# Patient Record
Sex: Male | Born: 1970 | Race: Black or African American | Hispanic: No | Marital: Married | Smoking: Current some day smoker
Health system: Southern US, Community
[De-identification: ages and names within clinical notes are randomized; demographics above are authoritative.]

## PROBLEM LIST (undated history)

## (undated) DIAGNOSIS — K5792 Diverticulitis of intestine, part unspecified, without perforation or abscess without bleeding: Secondary | ICD-10-CM

## (undated) DIAGNOSIS — N289 Disorder of kidney and ureter, unspecified: Secondary | ICD-10-CM

## (undated) HISTORY — PX: CHOLECYSTECTOMY: SHX55

## (undated) HISTORY — PX: HERNIA REPAIR: SHX51

## (undated) HISTORY — PX: SMALL INTESTINE SURGERY: SHX150

---

## 2018-11-06 ENCOUNTER — Other Ambulatory Visit (HOSPITAL_BASED_OUTPATIENT_CLINIC_OR_DEPARTMENT_OTHER): Payer: Self-pay

## 2018-11-06 DIAGNOSIS — G4733 Obstructive sleep apnea (adult) (pediatric): Secondary | ICD-10-CM

## 2018-11-30 ENCOUNTER — Other Ambulatory Visit: Payer: Self-pay

## 2018-11-30 ENCOUNTER — Encounter (HOSPITAL_BASED_OUTPATIENT_CLINIC_OR_DEPARTMENT_OTHER): Payer: BC Managed Care – PPO | Admitting: Internal Medicine

## 2018-11-30 ENCOUNTER — Ambulatory Visit (HOSPITAL_BASED_OUTPATIENT_CLINIC_OR_DEPARTMENT_OTHER): Payer: BC Managed Care – PPO | Attending: Otolaryngology | Admitting: Internal Medicine

## 2018-11-30 DIAGNOSIS — R0902 Hypoxemia: Secondary | ICD-10-CM | POA: Insufficient documentation

## 2018-11-30 DIAGNOSIS — G4733 Obstructive sleep apnea (adult) (pediatric): Secondary | ICD-10-CM | POA: Diagnosis present

## 2018-12-04 DIAGNOSIS — G4733 Obstructive sleep apnea (adult) (pediatric): Secondary | ICD-10-CM | POA: Insufficient documentation

## 2018-12-05 DIAGNOSIS — G4733 Obstructive sleep apnea (adult) (pediatric): Secondary | ICD-10-CM

## 2018-12-05 DIAGNOSIS — G4736 Sleep related hypoventilation in conditions classified elsewhere: Secondary | ICD-10-CM | POA: Diagnosis not present

## 2018-12-05 NOTE — Procedures (Signed)
   Patient Name: Cody Hurst, Cody Hurst Date: 11/30/2018 Gender: Male D.O.B: Aug 27, 1970 Age (years): 48 Referring Provider: Melida Quitter Height (inches): 44 Interpreting Physician: Baird Lyons MD, ABSM Weight (lbs): 250 RPSGT: Jacolyn Reedy BMI: 32 MRN: [id number] Neck Size: 17.00  CLINICAL INFORMATION Sleep Study Type: HST Indication for sleep study: OSA Epworth Sleepiness Score: 17  SLEEP STUDY TECHNIQUE A multi-channel overnight portable sleep study was performed. The channels recorded were: nasal airflow, thoracic respiratory movement, and oxygen saturation with a pulse oximetry. Snoring was also monitored.  MEDICATIONS Patient self administered medications include: none reported.  SLEEP ARCHITECTURE Patient was studied for 379.3 minutes. The sleep efficiency was 99.5 % and the patient was supine for 77.3%. The arousal index was 0.0 per hour.  RESPIRATORY PARAMETERS The overall AHI was 85.6 per hour, with a central apnea index of 0.0 per hour. The oxygen nadir was 80% during sleep.  CARDIAC DATA Mean heart rate during sleep was 72.7 bpm.  IMPRESSIONS - Severe obstructive sleep apnea occurred during this study (AHI = 85.6/h). - No significant central sleep apnea occurred during this study (CAI = 0.0/h). - Oxygen desaturation was noted during this study (Min O2 = 80%). Mean sat 93%. - Patient snored.  DIAGNOSIS - Obstructive Sleep Apnea (327.23 [G47.33 ICD-10]) - Nocturnal Hypoxemia (327.26 [G47.36 ICD-10])  RECOMMENDATIONS - Suggest CPAP titration sleep study or autopap. Other options would bee based on clinical judgment. - Be careful with alcohol, sedatives and other CNS depressants that may worsen sleep apnea and disrupt normal sleep architecture. - Sleep hygiene should be reviewed to assess factors that may improve sleep quality. - Weight management and regular exercise should be initiated or continued.  [Electronically signed] 12/05/2018 11:28 AM   Baird Lyons MD, ABSM Diplomate, American Board of Sleep Medicine   NPI: [id number]                          [place] [place] [facility]  ELECTRONICALLY SIGNED ON:  12/05/2018, 11:23 AM [place] PH: [phone]   FX: [phone] [place]

## 2019-01-19 ENCOUNTER — Other Ambulatory Visit: Payer: Self-pay

## 2019-01-19 DIAGNOSIS — Z20822 Contact with and (suspected) exposure to covid-19: Secondary | ICD-10-CM

## 2019-01-21 LAB — NOVEL CORONAVIRUS, NAA: SARS-CoV-2, NAA: NOT DETECTED

## 2019-06-02 ENCOUNTER — Ambulatory Visit (HOSPITAL_COMMUNITY)
Admission: EM | Admit: 2019-06-02 | Discharge: 2019-06-02 | Disposition: A | Discharge status: Home / Self Care | Payer: 59

## 2019-06-02 ENCOUNTER — Encounter (HOSPITAL_COMMUNITY): Payer: Self-pay

## 2019-06-02 ENCOUNTER — Other Ambulatory Visit: Payer: Self-pay

## 2019-06-02 DIAGNOSIS — M545 Low back pain, unspecified: Secondary | ICD-10-CM

## 2019-06-02 MED ORDER — PREDNISONE 10 MG PO TABS: ORAL_TABLET | ORAL | 0 refills | Status: DC

## 2019-06-02 MED ORDER — CYCLOBENZAPRINE HCL 5 MG PO TABS: 5.0000 mg | ORAL_TABLET | Freq: Two times a day (BID) | ORAL | 0 refills | Status: DC | PRN

## 2019-06-02 NOTE — ED Provider Notes (Signed)
[facility]    CSN: [id number] Arrival date & time: 06/02/19  1105      History   Chief Complaint Chief Complaint  Patient presents with  . Back Pain    HPI Cody Hurst is a 49 y.o. male no significant past medical history presenting today for evaluation of back pain.  Patient developed left lower back pain beginning Monday evening.  Denies any specific injury or trauma.  Does report repetitive lifting at work, but denies repetitive heavy lifting.  Denies any urinary symptoms.  Denies hematuria.  Does have history of prior kidney stones, but this pain is different.  Denies radiation into the leg or any numbness or tingling.  Does feel over the past 6 to 7 months at times he has had some paresthesias into left leg.  Using Tylenol.  Does report history of renal insufficiency, but believes his last measurements were returning to normal.  Does not have recent creatinine on file.  Denies saddle anesthesia.  Denies loss of control of urination or bowels.  HPI  History reviewed. No pertinent past medical history.  Patient Active Problem List   Diagnosis Date Noted  . OSA (obstructive sleep apnea) 12/04/2018    History reviewed. No pertinent surgical history.     Home Medications    Prior to Admission medications   Medication Sig Start Date End Date Taking? Authorizing Provider  fluticasone (FLONASE) 50 MCG/ACT nasal spray 2 sprays by Each Nare route daily. 10/13/18  Yes [provider]  naproxen (NAPROSYN) 500 MG tablet Take by mouth. 04/25/17  Yes [provider]  oxyCODONE-acetaminophen (PERCOCET/ROXICET) 5-325 MG tablet 1-2 tabs every 6 hours for severe pain for 1 week, then follow taper 05/05/15  Yes [provider]  B Complex Vitamins (VITAMIN B COMPLEX) TABS Take by mouth.    [provider]  cyclobenzaprine (FLEXERIL) 5 MG tablet Take 1-2 tablets (5-10 mg total) by mouth 2 (two) times daily as needed for muscle spasms. 06/02/19    Will Schier C, PA-C  Multiple Vitamin (MULTIVITAMIN) capsule Take by mouth.    [provider]  Omega-3 1000 MG CAPS Take by mouth.    [provider]  predniSONE (DELTASONE) 10 MG tablet Begin with 6 tabs on day 1, 5 tab on day 2, 4 tab on day 3, 3 tab on day 4, 2 tab on day 5, 1 tab on day 6-take with food 06/02/19   Codylee Patil C, PA-C  ranitidine (ZANTAC) 150 MG tablet Take by mouth.    [provider]    Family History Family History  Problem Relation Age of Onset  . Cancer Mother   . Allergies Mother   . Healthy Father     Social History Social History   Tobacco Use  . Smoking status: Never Smoker  . Smokeless tobacco: Never Used  Substance Use Topics  . Alcohol use: Not Currently  . Drug use: Never     Allergies   Patient has no known allergies.   Review of Systems Review of Systems  Constitutional: Negative for fever.  HENT: Negative for sore throat.   Respiratory: Negative for shortness of breath.   Cardiovascular: Negative for chest pain.  Gastrointestinal: Negative for abdominal pain, nausea and vomiting.  Genitourinary: Negative for difficulty urinating, discharge, dysuria, frequency, penile pain, penile swelling, scrotal swelling and testicular pain.  Musculoskeletal: Positive for back pain and myalgias.  Skin: Negative for rash.  Neurological: Negative for dizziness, light-headedness and headaches.  Physical Exam Triage Vital Signs ED Triage Vitals  Enc Vitals Group     BP 06/02/19 1221 125/77     Pulse Rate 06/02/19 1221 80     Resp 06/02/19 1221 18     Temp 06/02/19 1221 98.2 F (36.8 C)     Temp Source 06/02/19 1221 Oral     SpO2 06/02/19 1221 98 %     Weight 06/02/19 1217 238 lb 3.2 oz (108 kg)     Height --      Head Circumference --      Peak Flow --      Pain Score 06/02/19 1217 8     Pain Loc --      Pain Edu? --      Excl. in [place]? --    No data found.  Updated Vital Signs BP 125/77 (BP  Location: Right Arm)   Pulse 80   Temp 98.2 F (36.8 C) (Oral)   Resp 18   Wt 238 lb 3.2 oz (108 kg)   SpO2 98%   BMI 30.58 kg/m   Visual Acuity Right Eye Distance:   Left Eye Distance:   Bilateral Distance:    Right Eye Near:   Left Eye Near:    Bilateral Near:     Physical Exam Vitals and nursing note reviewed.  Constitutional:      Appearance: He is well-developed.     Comments: No acute distress  HENT:     Head: Normocephalic and atraumatic.     Nose: Nose normal.  Eyes:     Conjunctiva/sclera: Conjunctivae normal.  Cardiovascular:     Rate and Rhythm: Normal rate.  Pulmonary:     Effort: Pulmonary effort is normal. No respiratory distress.  Abdominal:     General: There is no distension.  Musculoskeletal:        General: Normal range of motion.     Cervical back: Neck supple.     Comments: Nontender to palpation of thoracic and lumbar spine midline, increased tenderness to lower lumbar paraspinal musculature extending more laterally, nontender on right  Strength at hips and knees 5/5 and equal bilaterally, patellar reflex 2+ bilaterally, negative straight leg raise  Skin:    General: Skin is warm and dry.  Neurological:     Mental Status: He is alert and oriented to person, place, and time.      UC Treatments / Results  Labs (all labs ordered are listed, but only abnormal results are displayed) Labs Reviewed - No data to display  EKG   Radiology No results found.  Procedures Procedures (including critical care time)  Medications Ordered in UC Medications - No data to display  Initial Impression / Assessment and Plan / UC Course  I have reviewed the triage vital signs and the nursing notes.  Pertinent labs & imaging results that were available during my care of the patient were reviewed by me and considered in my medical decision making (see chart for details).     Suspect likely muscular strain of lumbar region of back.  Has history of  renal insufficiency, but no recent creatinine.  Will opt for course of steroids over NSAIDs as well as muscle relaxers.  Discussed activity modification.  Discussed strict return precautions. Patient verbalized understanding and is agreeable with plan.  Final Clinical Impressions(s) / UC Diagnoses   Final diagnoses:  Acute left-sided low back pain without sciatica     Discharge Instructions     Begin prednisone taper  over the next 6 days-begin with 6 tablets, decrease by 1 tablet each day until complete, take with food and in the morning if you are able  You may use flexeril as needed to help with pain. This is a muscle relaxer and causes sedation- please use only at bedtime or when you will be home and not have to drive/work  Alternate ice and heat  Gentle stretching-see attached  Please follow-up if symptoms not improving or worsening   ED Prescriptions    Medication Sig Dispense Auth. Provider   predniSONE (DELTASONE) 10 MG tablet Begin with 6 tabs on day 1, 5 tab on day 2, 4 tab on day 3, 3 tab on day 4, 2 tab on day 5, 1 tab on day 6-take with food 21 tablet Pricsilla Lindvall C, PA-C   cyclobenzaprine (FLEXERIL) 5 MG tablet Take 1-2 tablets (5-10 mg total) by mouth 2 (two) times daily as needed for muscle spasms. 24 tablet Marlaya Turck, [place] C, PA-C     PDMP not reviewed this encounter.   Lew Dawes, PA-C 06/02/19 1244

## 2019-06-02 NOTE — Discharge Instructions (Signed)
Begin prednisone taper over the next 6 days-begin with 6 tablets, decrease by 1 tablet each day until complete, take with food and in the morning if you are able  You may use flexeril as needed to help with pain. This is a muscle relaxer and causes sedation- please use only at bedtime or when you will be home and not have to drive/work  Alternate ice and heat  Gentle stretching-see attached  Please follow-up if symptoms not improving or worsening

## 2019-06-02 NOTE — ED Triage Notes (Signed)
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 Pt has taken Tylenol to relieve discomfort.

## 2019-08-31 DIAGNOSIS — R1033 Periumbilical pain: Secondary | ICD-10-CM | POA: Insufficient documentation

## 2019-09-01 ENCOUNTER — Emergency Department (HOSPITAL_COMMUNITY): Payer: Self-pay

## 2019-09-01 ENCOUNTER — Emergency Department (HOSPITAL_COMMUNITY)
Admission: EM | Admit: 2019-09-01 | Discharge: 2019-09-01 | Disposition: A | Discharge status: Home / Self Care | Payer: Self-pay | Attending: Emergency Medicine | Admitting: Emergency Medicine

## 2019-09-01 ENCOUNTER — Other Ambulatory Visit: Payer: Self-pay

## 2019-09-01 ENCOUNTER — Encounter (HOSPITAL_COMMUNITY): Payer: Self-pay | Admitting: Emergency Medicine

## 2019-09-01 DIAGNOSIS — R1033 Periumbilical pain: Secondary | ICD-10-CM

## 2019-09-01 LAB — COMPREHENSIVE METABOLIC PANEL
ALT: 35 U/L (ref 0–44)
AST: 24 U/L (ref 15–41)
Albumin: 3.7 g/dL (ref 3.5–5.0)
Alkaline Phosphatase: 81 U/L (ref 38–126)
Anion gap: 13 (ref 5–15)
BUN: 21 mg/dL — ABNORMAL HIGH (ref 6–20)
CO2: 24 mmol/L (ref 22–32)
Calcium: 9.5 mg/dL (ref 8.9–10.3)
Chloride: 102 mmol/L (ref 98–111)
Creatinine, Ser: 1.32 mg/dL — ABNORMAL HIGH (ref 0.61–1.24)
GFR calc Af Amer: >60 mL/min (ref >60)
GFR calc non Af Amer: >60 mL/min (ref >60)
Glucose, Bld: 126 mg/dL — ABNORMAL HIGH (ref 70–99)
Potassium: 3.9 mmol/L (ref 3.5–5.1)
Sodium: 139 mmol/L (ref 135–145)
Total Bilirubin: 0.6 mg/dL (ref 0.3–1.2)
Total Protein: 7.1 g/dL (ref 6.5–8.1)

## 2019-09-01 LAB — CBC
HCT: 50.5 % (ref 39.0–52.0)
Hemoglobin: 16.2 g/dL (ref 13.0–17.0)
MCH: 30.6 pg (ref 26.0–34.0)
MCHC: 32.1 g/dL (ref 30.0–36.0)
MCV: 95.5 fL (ref 80.0–100.0)
Platelets: 217 10*3/uL (ref 150–400)
RBC: 5.29 MIL/uL (ref 4.22–5.81)
RDW: 13.3 % (ref 11.5–15.5)
WBC: 9.3 10*3/uL (ref 4.0–10.5)
nRBC: 0 % (ref 0.0–0.2)

## 2019-09-01 LAB — URINALYSIS, ROUTINE W REFLEX MICROSCOPIC
Bilirubin Urine: NEGATIVE
Glucose, UA: NEGATIVE mg/dL
Hgb urine dipstick: NEGATIVE
Ketones, ur: NEGATIVE mg/dL
Leukocytes,Ua: NEGATIVE
Nitrite: NEGATIVE
Protein, ur: NEGATIVE mg/dL
Specific Gravity, Urine: 1.015 (ref 1.005–1.030)
pH: 6 (ref 5.0–8.0)

## 2019-09-01 LAB — LIPASE, BLOOD: Lipase: 29 U/L (ref 11–51)

## 2019-09-01 MED ORDER — IOHEXOL 300 MG/ML  SOLN
100.0000 mL | Freq: Once | INTRAMUSCULAR | Status: AC | PRN
  Administered 2019-09-01: 100 mL via INTRAVENOUS

## 2019-09-01 MED ORDER — ONDANSETRON HCL 4 MG/2ML IJ SOLN
4.0000 mg | Freq: Once | INTRAMUSCULAR | Status: AC
  Administered 2019-09-01: 4 mg via INTRAVENOUS
  Filled 2019-09-01: qty 2

## 2019-09-01 MED ORDER — SODIUM CHLORIDE 0.9 % IV BOLUS
1000.0000 mL | Freq: Once | INTRAVENOUS | Status: AC
  Administered 2019-09-01: 1000 mL via INTRAVENOUS

## 2019-09-01 MED ORDER — MORPHINE SULFATE (PF) 4 MG/ML IV SOLN
4.0000 mg | Freq: Once | INTRAVENOUS | Status: AC
  Administered 2019-09-01: 4 mg via INTRAVENOUS
  Filled 2019-09-01: qty 1

## 2019-09-01 MED ORDER — SODIUM CHLORIDE 0.9% FLUSH: 3.0000 mL | Freq: Once | INTRAVENOUS | Status: DC

## 2019-09-01 NOTE — ED Notes (Signed)
Requested work note from [place]. Patient verbalizes understanding of discharge instructions. Opportunity for questioning and answers were provided. Armband removed by staff, pt discharged from ED

## 2019-09-01 NOTE — Discharge Instructions (Signed)
You may start taking a stool softener given your history of diverticulitis.  Follow-up with general surgery for evaluation of your abdominal hernia.  Try not to lift or strain as this may cause your hernia to worsen.  If you notice a severe bulging to your umbilical area which is not able to be pushed back into place please seek reevaluation emergency department

## 2019-09-01 NOTE — ED Provider Notes (Signed)
[facility] EMERGENCY [facility] Provider Note   CSN: [id number] Arrival date & time: 08/31/19  2358    History Chief Complaint  Patient presents with  . Abdominal Pain    Cody Hurst is a 49 y.o. male with past medical history significant for diverticulitis, OSA, umbilical hernia, prior bowel resection who presents for evaluation of abdominal pain.  Patient with intermittent lower abdominal pain however worsening this morning.  States he does a lot of lifting and pulling at worst.  He rates his pain a 3/10.  States his last bowel movement this morning was very small.  No melena or bright red blood per rectum.  He is passing flatulence.  He is not taking thing for his pain.  Patient states he has had intermittent abdominal pain located over the site of his hernias over the last year.  States these are typically worse when he lifts heavy objects at work.  He denies any headache, lightheadedness, dizziness, chest pain, shortness of breath, dysuria, diarrhea, constipation.  Denies additional aggravating or alleviating factors.  History obtained from patient and past medical records.  No interpreter is used.  HPI     History reviewed. No pertinent past medical history.  Patient Active Problem List   Diagnosis Date Noted  . OSA (obstructive sleep apnea) 12/04/2018    History reviewed. No pertinent surgical history.     Family History  Problem Relation Age of Onset  . Cancer Mother   . Allergies Mother   . Healthy Father     Social History   Tobacco Use  . Smoking status: Never Smoker  . Smokeless tobacco: Never Used  Substance Use Topics  . Alcohol use: Not Currently  . Drug use: Never    Home Medications Prior to Admission medications   Medication Sig Start Date End Date Taking? Authorizing Provider  acetaminophen (TYLENOL) 500 MG tablet Take 1,000 mg by mouth every 6 (six) hours as needed for mild pain or headache.   Yes [provider]    Multiple Vitamin (MULTIVITAMIN) capsule Take 1 capsule by mouth daily.    Yes [provider]  cyclobenzaprine (FLEXERIL) 5 MG tablet Take 1-2 tablets (5-10 mg total) by mouth 2 (two) times daily as needed for muscle spasms. Patient not taking: Reported on 09/01/2019 06/02/19   Wieters, Hallie C, PA-C  predniSONE (DELTASONE) 10 MG tablet Begin with 6 tabs on day 1, 5 tab on day 2, 4 tab on day 3, 3 tab on day 4, 2 tab on day 5, 1 tab on day 6-take with food Patient not taking: Reported on 09/01/2019 06/02/19   Wieters, Fran [place] C, PA-C    Allergies    Patient has no known allergies.  Review of Systems   Review of Systems  Constitutional: Negative.   HENT: Negative.   Eyes: Negative.   Respiratory: Negative.   Cardiovascular: Negative.   Gastrointestinal: Positive for abdominal pain. Negative for abdominal distention, anal bleeding, blood in stool, constipation, diarrhea, nausea, rectal pain and vomiting.  Genitourinary: Negative.   Skin: Negative.   Neurological: Negative.   All other systems reviewed and are negative.  Physical Exam Updated Vital Signs BP 106/81   Pulse 65   Temp 97.9 F (36.6 C) (Oral)   Resp 16   SpO2 96%   Physical Exam Vitals and nursing note reviewed.  Constitutional:      General: He is not in acute distress.    Appearance: He is well-developed. He is not ill-appearing,  toxic-appearing or diaphoretic.  HENT:     Head: Normocephalic and atraumatic.     Mouth/Throat:     Mouth: Mucous membranes are moist.  Eyes:     Pupils: Pupils are equal, round, and reactive to light.  Cardiovascular:     Rate and Rhythm: Normal rate and regular rhythm.     Heart sounds: Normal heart sounds.  Pulmonary:     Effort: Pulmonary effort is normal. No respiratory distress.     Breath sounds: Normal breath sounds.  Abdominal:     General: Bowel sounds are normal. There is no distension.     Palpations: Abdomen is soft.     Tenderness: There is no abdominal  tenderness. There is no right CVA tenderness, left CVA tenderness, guarding or rebound. Negative signs include Murphy's sign and McBurney's sign.     Hernia: No hernia is present.     Comments: Periumbilical hernia able to reduce.  Nontender.  Old midline abdominal incision without edema, erythema or warmth  Musculoskeletal:        General: Normal range of motion.     Cervical back: Normal range of motion and neck supple.  Skin:    General: Skin is warm and dry.  Neurological:     General: No focal deficit present.     Mental Status: He is alert and oriented to person, place, and time.    ED Results / Procedures / Treatments   Labs (all labs ordered are listed, but only abnormal results are displayed) Labs Reviewed  COMPREHENSIVE METABOLIC PANEL - Abnormal; Notable for the following components:      Result Value   Glucose, Bld 126 (*)    BUN 21 (*)    Creatinine, Ser 1.32 (*)    All other components within normal limits  LIPASE, BLOOD  CBC  URINALYSIS, ROUTINE W REFLEX MICROSCOPIC   EKG None  Radiology No results found.  Procedures Procedures (including critical care time)  Medications Ordered in ED Medications  sodium chloride flush (NS) 0.9 % injection 3 mL (has no administration in time range)  morphine 4 MG/ML injection 4 mg (4 mg Intravenous Given 09/01/19 1140)  ondansetron (ZOFRAN) injection 4 mg (4 mg Intravenous Given 09/01/19 1138)  sodium chloride 0.9 % bolus 1,000 mL (1,000 mLs Intravenous New Bag/Given 09/01/19 1138)  iohexol (OMNIPAQUE) 300 MG/ML solution 100 mL (100 mLs Intravenous Contrast Given 09/01/19 1219)   ED Course  I have reviewed the triage vital signs and the nursing notes.  Pertinent labs & imaging results that were available during my care of the patient were reviewed by me and considered in my medical decision making (see chart for details).  49 year old presents for evaluation of intermittent abdominal pain worsening over the last 2 days.  Known  history of umbilical hernia after extensive bowel resection due to prior of diverticulitis.  Last bowel movement this morning which she states was small.  No melena or bright blood per rectum.  Does have a reducible umbilical hernia.  Abdomen soft without rebound or guarding.  Heart lungs clear.  He is afebrile, nonseptic, not ill-appearing.  No chest pain, shortness of breath or midline abdominal pain radiating to his back.  No GU complaints.  She does not anything for pain at this time.  Labs imaging personally reviewed and interpreted: CBC without leukocytosis UA negative Metabolic panel with creatinine 1.32, no prior to compare Hyperglycemia to 126 Lipase 29 CTAP with periumbilical hernia. Right nephrolithiasis. No evidence of ureteral calculi,  hydronephrosis, or other acute findings. Hepatic steatosis. Small paraumbilical ventral hernia containing a small bowel loop.  Patient is nontoxic, nonseptic appearing, in no apparent distress.  Patient's pain and other symptoms adequately managed in emergency department.  Fluid bolus given.  Labs, imaging and vitals reviewed.  Patient does not meet the SIRS or Sepsis criteria.  On repeat exam patient does not have a surgical abdomin and there are no peritoneal signs.  No indication of appendicitis, bowel obstruction, bowel perforation, cholecystitis, diverticulitis, incarcerated or strangulated hernia  Patient reassessed.  He has no current pain.  Patient states pain is typically when he moves.  I have suspicion patient's pain is due to his hernia given when he relaxes his pain is resolved.  Will have him follow-up outpatient with general surgery.  He is tolerating p.o. intake without difficulty.  The patient has been appropriately medically screened and/or stabilized in the ED. I have low suspicion for any other emergent medical condition which would require further screening, evaluation or treatment in the ED or require inpatient management.  Patient is  hemodynamically stable and in no acute distress.  Patient able to ambulate in department prior to ED.  Evaluation does not show acute pathology that would require ongoing or additional emergent interventions while in the emergency department or further inpatient treatment.  I have discussed the diagnosis with the patient and answered all questions.  Pain is been managed while in the emergency department and patient has no further complaints prior to discharge.  Patient is comfortable with plan discussed in room and is stable for discharge at this time.  I have discussed strict return precautions for returning to the emergency department.  Patient was encouraged to follow-up with PCP/specialist refer to at discharge.       MDM Rules/Calculators/A&P                           Final Clinical Impression(s) / ED Diagnoses Final diagnoses:  Periumbilical abdominal pain    Rx / DC Orders ED Discharge Orders    None       Patsy Zaragoza A, PA-C 09/01/19 1359    Geoffery Lyons, MD 09/01/19 1540

## 2019-09-01 NOTE — ED Triage Notes (Signed)
Pt c/o lower abdominal pain, denies nausea/vomiting/diarrhea/urinary symptoms. Hx hernia.

## 2020-04-18 ENCOUNTER — Emergency Department (HOSPITAL_COMMUNITY)
Admission: EM | Admit: 2020-04-18 | Discharge: 2020-04-19 | Disposition: A | Discharge status: Home / Self Care | Payer: PRIVATE HEALTH INSURANCE | Attending: Emergency Medicine | Admitting: Emergency Medicine

## 2020-04-18 ENCOUNTER — Encounter (HOSPITAL_COMMUNITY): Payer: Self-pay

## 2020-04-18 ENCOUNTER — Other Ambulatory Visit: Payer: Self-pay

## 2020-04-18 DIAGNOSIS — R112 Nausea with vomiting, unspecified: Secondary | ICD-10-CM

## 2020-04-18 DIAGNOSIS — Z20822 Contact with and (suspected) exposure to covid-19: Secondary | ICD-10-CM | POA: Diagnosis not present

## 2020-04-18 DIAGNOSIS — R5383 Other fatigue: Secondary | ICD-10-CM | POA: Insufficient documentation

## 2020-04-18 DIAGNOSIS — K5732 Diverticulitis of large intestine without perforation or abscess without bleeding: Secondary | ICD-10-CM | POA: Insufficient documentation

## 2020-04-18 DIAGNOSIS — R42 Dizziness and giddiness: Secondary | ICD-10-CM | POA: Insufficient documentation

## 2020-04-18 DIAGNOSIS — R252 Cramp and spasm: Secondary | ICD-10-CM | POA: Insufficient documentation

## 2020-04-18 DIAGNOSIS — R1084 Generalized abdominal pain: Secondary | ICD-10-CM | POA: Insufficient documentation

## 2020-04-18 DIAGNOSIS — R197 Diarrhea, unspecified: Secondary | ICD-10-CM | POA: Insufficient documentation

## 2020-04-18 DIAGNOSIS — R519 Headache, unspecified: Secondary | ICD-10-CM | POA: Diagnosis not present

## 2020-04-18 HISTORY — DX: Disorder of kidney and ureter, unspecified: N28.9

## 2020-04-18 HISTORY — DX: Diverticulitis of intestine, part unspecified, without perforation or abscess without bleeding: K57.92

## 2020-04-18 LAB — URINALYSIS, ROUTINE W REFLEX MICROSCOPIC
Bacteria, UA: NONE SEEN
Bilirubin Urine: NEGATIVE
Glucose, UA: NEGATIVE mg/dL
Ketones, ur: NEGATIVE mg/dL
Leukocytes,Ua: NEGATIVE
Nitrite: NEGATIVE
Protein, ur: NEGATIVE mg/dL
Specific Gravity, Urine: 1.021 (ref 1.005–1.030)
pH: 5 (ref 5.0–8.0)

## 2020-04-18 LAB — CBC
HCT: 51.9 % (ref 39.0–52.0)
Hemoglobin: 16.9 g/dL (ref 13.0–17.0)
MCH: 30.7 pg (ref 26.0–34.0)
MCHC: 32.6 g/dL (ref 30.0–36.0)
MCV: 94.4 fL (ref 80.0–100.0)
Platelets: 182 10*3/uL (ref 150–400)
RBC: 5.5 MIL/uL (ref 4.22–5.81)
RDW: 13.6 % (ref 11.5–15.5)
WBC: 5.5 10*3/uL (ref 4.0–10.5)
nRBC: 0 % (ref 0.0–0.2)

## 2020-04-18 LAB — COMPREHENSIVE METABOLIC PANEL
ALT: 31 U/L (ref 0–44)
AST: 22 U/L (ref 15–41)
Albumin: 4.2 g/dL (ref 3.5–5.0)
Alkaline Phosphatase: 72 U/L (ref 38–126)
Anion gap: 9 (ref 5–15)
BUN: 22 mg/dL — ABNORMAL HIGH (ref 6–20)
CO2: 23 mmol/L (ref 22–32)
Calcium: 8.9 mg/dL (ref 8.9–10.3)
Chloride: 108 mmol/L (ref 98–111)
Creatinine, Ser: 1.16 mg/dL (ref 0.61–1.24)
GFR, Estimated: >60 mL/min (ref >60)
Glucose, Bld: 96 mg/dL (ref 70–99)
Potassium: 4.3 mmol/L (ref 3.5–5.1)
Sodium: 140 mmol/L (ref 135–145)
Total Bilirubin: 0.8 mg/dL (ref 0.3–1.2)
Total Protein: 7.4 g/dL (ref 6.5–8.1)

## 2020-04-18 LAB — CK: Total CK: 321 U/L (ref 49–397)

## 2020-04-18 LAB — POC OCCULT BLOOD, ED: Fecal Occult Bld: NEGATIVE

## 2020-04-18 LAB — LIPASE, BLOOD: Lipase: 28 U/L (ref 11–51)

## 2020-04-18 MED ORDER — KETOROLAC TROMETHAMINE 30 MG/ML IJ SOLN
30.0000 mg | Freq: Once | INTRAMUSCULAR | Status: AC
  Administered 2020-04-18: 30 mg via INTRAVENOUS
  Filled 2020-04-18: qty 1

## 2020-04-18 MED ORDER — SODIUM CHLORIDE 0.9 % IV BOLUS
1000.0000 mL | Freq: Once | INTRAVENOUS | Status: AC
  Administered 2020-04-18: 1000 mL via INTRAVENOUS

## 2020-04-18 MED ORDER — ONDANSETRON 4 MG PO TBDP
4.0000 mg | ORAL_TABLET | Freq: Once | ORAL | Status: AC | PRN
  Administered 2020-04-18: 4 mg via ORAL
  Filled 2020-04-18: qty 1

## 2020-04-18 MED ORDER — METOCLOPRAMIDE HCL 5 MG/ML IJ SOLN
10.0000 mg | Freq: Once | INTRAMUSCULAR | Status: AC
  Administered 2020-04-18: 10 mg via INTRAVENOUS
  Filled 2020-04-18: qty 2

## 2020-04-18 NOTE — ED Provider Notes (Signed)
[facility]-EMERGENCY DEPT Provider Note   CSN: [id number] Arrival date & time: 04/18/20  1701     History Chief Complaint  Patient presents with  . Diarrhea  . Headache  . Fatigue  . Abdominal Pain  . Fever    Cody Hurst is a 50 y.o. male with a history of diverticulitis s/p sigmoid colectomy with anastomosis, nephrolithiasis who presents the emergency department with a chief complaint of abdominal pain.  The patient reports constant, 7 out of 10, diffuse abdominal pain characterized as cramping that began today. Pain is non-radiating. Pain has been accompanied by nonbloody, nonbilious vomiting x8 and frequent episodes of diarrhea that also began today. Reports that diarrhea was his initial symptom followed by vomiting, abdominal pain, bilateral low back pain. He reports that he feels very dehydrated with fatigue and generalized muscle cramps. He has also developed a generalized headache with dizziness and lightheadedness and endorses a subjective fever and chills. He has not noticed any gross blood in his diarrhea, but earlier noted that it "had a metallic odor".   He denies dysuria, hematuria, neck pain or stiffness, rash, penile or testicular pain or swelling, chest pain, shortness of breath, cough, nasal congestion or rhinorrhea, constipation, numbness, weakness, visual changes, or flank pain. He has only voided 3 times today since onset of symptoms.  He does have a history of diverticulitis that required sigmoid colectomy and anastomosis many years ago. States that his current episode of pain feels different. He also has passed more than 30 kidney stones in his lifetime and does not think that the pain feels similar. He ate sushi yesterday at 1600, but was asymptomatic until he awoke this morning. His daughter also ate a small amount of the sushi and has been asymptomatic. He is vaccinated against COVID-19. No known sick contacts.  Cody Hurst was  evaluated in Emergency Department on 04/19/2020 for the symptoms described in the history of present illness. He was evaluated in the context of the global COVID-19 pandemic, which necessitated consideration that the patient might be at risk for infection with the SARS-CoV-2 virus that causes COVID-19. Institutional protocols and algorithms that pertain to the evaluation of patients at risk for COVID-19 are in a state of rapid change based on information released by regulatory bodies including the CDC and federal and state organizations. These policies and algorithms were followed during the patient's care in the ED.   The history is provided by the patient and medical records. No language interpreter was used.       Past Medical History:  Diagnosis Date  . Diverticulitis   . Renal disorder    kidney stones    Patient Active Problem List   Diagnosis Date Noted  . OSA (obstructive sleep apnea) 12/04/2018    Past Surgical History:  Procedure Laterality Date  . HERNIA REPAIR    . SMALL INTESTINE SURGERY         Family History  Problem Relation Age of Onset  . Cancer Mother   . Allergies Mother   . Healthy Father     Social History   Tobacco Use  . Smoking status: Never Smoker  . Smokeless tobacco: Never Used  Vaping Use  . Vaping Use: Never used  Substance Use Topics  . Alcohol use: Not Currently  . Drug use: Never    Home Medications Prior to Admission medications   Medication Sig Start Date End Date Taking? Authorizing Provider  loratadine-pseudoephedrine (CLARITIN-D 12 HOUR) 5-120 MG tablet  Take 1 tablet by mouth daily as needed for allergies.   Yes [provider]  ondansetron (ZOFRAN ODT) 4 MG disintegrating tablet Take 1 tablet (4 mg total) by mouth every 8 (eight) hours as needed. 04/19/20  Yes Brooklinn Longbottom A, PA-C  phenylephrine (NEO-SYNEPHRINE) 0.25 % nasal spray Place 1 spray into both nostrils every 6 (six) hours as needed for congestion.   Yes  [provider]    Allergies    Patient has no known allergies.  Review of Systems   Review of Systems  Constitutional: Positive for chills, fatigue and fever. Negative for appetite change and diaphoresis.  HENT: Negative for congestion, rhinorrhea and sore throat.   Respiratory: Negative for cough, shortness of breath and wheezing.   Cardiovascular: Negative for chest pain and palpitations.  Gastrointestinal: Positive for abdominal pain, diarrhea, nausea and vomiting. Negative for anal bleeding, blood in stool and constipation.  Genitourinary: Negative for dysuria, flank pain, frequency, hematuria, penile discharge, penile pain, penile swelling, scrotal swelling and urgency.  Musculoskeletal: Positive for myalgias. Negative for arthralgias, back pain, neck pain and neck stiffness.  Skin: Negative for rash and wound.  Allergic/Immunologic: Negative for immunocompromised state.  Neurological: Positive for dizziness, light-headedness and headaches. Negative for seizures, syncope, facial asymmetry and weakness.  Psychiatric/Behavioral: Negative for confusion.    Physical Exam Updated Vital Signs BP 103/74   Pulse 85   Temp 97.6 F (36.4 C)   Resp 16   Ht 6\' 2"  (1.88 m)   Wt 109.3 kg   SpO2 96%   BMI 30.94 kg/m   Physical Exam Vitals and nursing note reviewed.  Constitutional:      General: He is not in acute distress.    Appearance: He is well-developed. He is not ill-appearing, toxic-appearing or diaphoretic.  HENT:     Head: Normocephalic.     Mouth/Throat:     Mouth: Mucous membranes are moist.  Eyes:     Conjunctiva/sclera: Conjunctivae normal.  Cardiovascular:     Rate and Rhythm: Normal rate and regular rhythm.     Pulses: Normal pulses.     Heart sounds: Normal heart sounds. No murmur heard. No friction rub. No gallop.   Pulmonary:     Effort: Pulmonary effort is normal. No respiratory distress.     Breath sounds: No stridor. No wheezing, rhonchi or  rales.  Chest:     Chest wall: No tenderness.  Abdominal:     General: There is no distension.     Palpations: Abdomen is soft. There is no mass.     Tenderness: There is abdominal tenderness. There is no right CVA tenderness, left CVA tenderness, guarding or rebound.     Hernia: No hernia is present.     Comments: Generalized tenderness to palpation throughout the abdomen. No rebound or guarding. Hyperactive bowel sounds in all 4 quadrants. Well-healed midline surgical scars noted to the abdominal wall without signs of infection or wound dehiscence.  Musculoskeletal:     Cervical back: Neck supple.     Right lower leg: No edema.     Left lower leg: No edema.  Skin:    General: Skin is warm and dry.     Capillary Refill: Capillary refill takes 2 to 3 seconds.     Coloration: Skin is not jaundiced or pale.     Findings: No erythema or rash.  Neurological:     General: No focal deficit present.     Mental Status: He is alert.  Psychiatric:        Behavior: Behavior normal.     ED Results / Procedures / Treatments   Labs (all labs ordered are listed, but only abnormal results are displayed) Labs Reviewed  COMPREHENSIVE METABOLIC PANEL - Abnormal; Notable for the following components:      Result Value   BUN 22 (*)    All other components within normal limits  URINALYSIS, ROUTINE W REFLEX MICROSCOPIC - Abnormal; Notable for the following components:   Hgb urine dipstick SMALL (*)    All other components within normal limits  SARS CORONAVIRUS 2 (TAT 6-24 HRS)  LIPASE, BLOOD  CBC  CK  POC OCCULT BLOOD, ED    EKG None  Radiology No results found.  Procedures Procedures   Medications Ordered in ED Medications  ondansetron (ZOFRAN-ODT) disintegrating tablet 4 mg (4 mg Oral Given 04/18/20 1744)  metoCLOPramide (REGLAN) injection 10 mg (10 mg Intravenous Given 04/18/20 2330)  sodium chloride 0.9 % bolus 1,000 mL (0 mLs Intravenous Stopped 04/19/20 0010)  ketorolac  (TORADOL) 30 MG/ML injection 30 mg (30 mg Intravenous Given 04/18/20 2329)    ED Course  I have reviewed the triage vital signs and the nursing notes.  Pertinent labs & imaging results that were available during my care of the patient were reviewed by me and considered in my medical decision making (see chart for details).    MDM Rules/Calculators/A&P                          50 year old male with a history of diverticulitis s/p sigmoid colectomy with anastomosis, nephrolithiasis who presents to the emergency department with  N/V/D, abdominal pain, subjective fever, chills, fatigue, muscle cramps, headache, and dizziness and lightheadedness, that began today. No treatment prior to arrival.  Afebrile. No tachycardia. Normotensive. No hypoxia or tachypnea.   I am most suspicious for viral gastroenteritis given abrupt onset of symptoms within the last 24 hours, but differential diagnosis also includes ingestion of undercooked food, diverticulitis, pyelonephritis, obstructive uropathy, COVID-19, pancreatitis, mesenteric ischemia.  Labs have been reviewed and independently interpreted by me.  No leukocytosis. No metabolic derangements. UA with hemoglobinuria, but patient has known history of right nephrolithiasis based on CT scan from July 2021. However, I am less suspicious for obstructive uropathy given his presentation today. Symptoms are most consistent with gastroenteritis. We will plan for fluid resuscitation, pain control, and antiemetics. He will need to be fluid challenge. He did also mention that his stool had a metallic smell and he is agreeable to Hemoccult study. However, hemoglobin is stable from previous. COVID-19 test has also been ordered and is pending.  Orthostatic vital signs are negative.  Patient has been successfully fluid challenge.  Pain has resolved.  Nausea has resolved and he has also been successfully fluid challenge.  On re-evaluation, patient is asymptomatic and feels  ready for discharge.  Suspect viral etiology, given onset of symptoms within the last 24 hours.  No indication for antibiotics for diverticulitis.  Will discharge home with symptomatic treatment.  ER return precautions given.  He is hemodynamically stable no acute distress.  Safe for discharge home with outpatient follow-up as needed.  Final Clinical Impression(s) / ED Diagnoses Final diagnoses:  Nausea, vomiting and diarrhea    Rx / DC Orders ED Discharge Orders         Ordered    ondansetron (ZOFRAN ODT) 4 MG disintegrating tablet  Every 8 hours PRN  04/19/20 0102           Barkley Boards, PA-C 04/19/20 0131    Dione Booze, MD 04/19/20 [phone]

## 2020-04-18 NOTE — ED Triage Notes (Signed)
Patient c/o diarrhea that started yesterday. Today patient c/o fever, abdominal pain. Patient states he ate homemade sushi from a coworker yesterday.

## 2020-04-19 LAB — SARS CORONAVIRUS 2 (TAT 6-24 HRS): SARS Coronavirus 2: NEGATIVE

## 2020-04-19 MED ORDER — ONDANSETRON 4 MG PO TBDP: 4.0000 mg | ORAL_TABLET | Freq: Three times a day (TID) | ORAL | 0 refills | Status: AC | PRN

## 2020-04-19 NOTE — Discharge Instructions (Signed)
Thank you for allowing me to care for you today in the Emergency Department.   Your symptoms are most likely related to a viral illness.  However, they could be from COVID-19.  Your COVID-19 test is pending.  You should receive a call from the hospital if your test is positive.  You can also review the results of your test in MyChart if you have an account.  If you have not created a MyChart account, you can follow the instructions on your discharge paperwork for account creation.  If your symptoms are from COVID-19, you need to quarantine at home for at least 5 days after your symptoms began.  If your test is negative, you can return to work on 04/20/20.   Let 1 tablet of Zofran dissolve in your tongue every 8 hours as needed for nausea or vomiting.  Make sure that you are drinking plenty of fluids.  Try to drink at least 64 ounces of fluids daily to avoid dehydration.  If your symptoms do not significantly resolve in the next 72 hours, you can follow-up with primary care for reevaluation.  Return to the emergency department if you develop uncontrollable abdominal pain with temperatures greater than 100.4, black or bloody stool, gross blood in your vomit, if you stop making urine, if you pass out, or if you develop other new, concerning symptoms.

## 2020-04-19 NOTE — ED Notes (Signed)
Pt given water and crackers to PO challenge

## 2020-08-03 ENCOUNTER — Other Ambulatory Visit: Payer: Self-pay

## 2020-08-03 ENCOUNTER — Ambulatory Visit (INDEPENDENT_AMBULATORY_CARE_PROVIDER_SITE_OTHER): Payer: PRIVATE HEALTH INSURANCE

## 2020-08-03 ENCOUNTER — Ambulatory Visit (HOSPITAL_COMMUNITY)
Admission: EM | Admit: 2020-08-03 | Discharge: 2020-08-03 | Disposition: A | Discharge status: Home / Self Care | Payer: PRIVATE HEALTH INSURANCE | Attending: Internal Medicine | Admitting: Internal Medicine

## 2020-08-03 ENCOUNTER — Encounter (HOSPITAL_COMMUNITY): Payer: Self-pay

## 2020-08-03 DIAGNOSIS — R1084 Generalized abdominal pain: Secondary | ICD-10-CM

## 2020-08-03 DIAGNOSIS — M545 Low back pain, unspecified: Secondary | ICD-10-CM

## 2020-08-03 DIAGNOSIS — K5900 Constipation, unspecified: Secondary | ICD-10-CM | POA: Diagnosis not present

## 2020-08-03 DIAGNOSIS — Z87442 Personal history of urinary calculi: Secondary | ICD-10-CM | POA: Diagnosis not present

## 2020-08-03 LAB — CBC WITH DIFFERENTIAL/PLATELET
Abs Immature Granulocytes: 0.01 10*3/uL (ref 0.00–0.07)
Basophils Absolute: 0 10*3/uL (ref 0.0–0.1)
Basophils Relative: 1 %
Eosinophils Absolute: 0.1 10*3/uL (ref 0.0–0.5)
Eosinophils Relative: 1 %
HCT: 46.3 % (ref 39.0–52.0)
Hemoglobin: 15.4 g/dL (ref 13.0–17.0)
Immature Granulocytes: 0 %
Lymphocytes Relative: 32 %
Lymphs Abs: 2 10*3/uL (ref 0.7–4.0)
MCH: 30.9 pg (ref 26.0–34.0)
MCHC: 33.3 g/dL (ref 30.0–36.0)
MCV: 92.8 fL (ref 80.0–100.0)
Monocytes Absolute: 0.7 10*3/uL (ref 0.1–1.0)
Monocytes Relative: 11 %
Neutro Abs: 3.6 10*3/uL (ref 1.7–7.7)
Neutrophils Relative %: 55 %
Platelets: 197 10*3/uL (ref 150–400)
RBC: 4.99 MIL/uL (ref 4.22–5.81)
RDW: 13.4 % (ref 11.5–15.5)
WBC: 6.4 10*3/uL (ref 4.0–10.5)
nRBC: 0 % (ref 0.0–0.2)

## 2020-08-03 LAB — COMPREHENSIVE METABOLIC PANEL
ALT: 27 U/L (ref 0–44)
AST: 22 U/L (ref 15–41)
Albumin: 3.8 g/dL (ref 3.5–5.0)
Alkaline Phosphatase: 71 U/L (ref 38–126)
Anion gap: 9 (ref 5–15)
BUN: 16 mg/dL (ref 6–20)
CO2: 26 mmol/L (ref 22–32)
Calcium: 8.9 mg/dL (ref 8.9–10.3)
Chloride: 103 mmol/L (ref 98–111)
Creatinine, Ser: 1.43 mg/dL — ABNORMAL HIGH (ref 0.61–1.24)
GFR, Estimated: >60 mL/min (ref >60)
Glucose, Bld: 102 mg/dL — ABNORMAL HIGH (ref 70–99)
Potassium: 4 mmol/L (ref 3.5–5.1)
Sodium: 138 mmol/L (ref 135–145)
Total Bilirubin: 0.8 mg/dL (ref 0.3–1.2)
Total Protein: 6.9 g/dL (ref 6.5–8.1)

## 2020-08-03 LAB — POCT URINALYSIS DIPSTICK, ED / UC
Bilirubin Urine: NEGATIVE
Glucose, UA: NEGATIVE mg/dL
Hgb urine dipstick: NEGATIVE
Ketones, ur: NEGATIVE mg/dL
Leukocytes,Ua: NEGATIVE
Nitrite: NEGATIVE
Protein, ur: NEGATIVE mg/dL
Specific Gravity, Urine: >=1.03 (ref 1.005–1.030)
Urobilinogen, UA: 0.2 mg/dL (ref 0.0–1.0)
pH: 6 (ref 5.0–8.0)

## 2020-08-03 MED ORDER — DICYCLOMINE HCL 10 MG PO CAPS: 10.0000 mg | ORAL_CAPSULE | Freq: Three times a day (TID) | ORAL | 0 refills | Status: AC

## 2020-08-03 NOTE — Discharge Instructions (Signed)
Your abdominal x-ray did not show any evidence of constipation or obstruction which is good news.  I have called in a medication (dicyclomine) that will help with stomach cramping.  You can take this 4 times a day.  I recommend you eat a bland diet and drink plenty of fluid.  I have included the information for a gastroenterologist I would like you to follow-up with them.  As we discussed if anything worsens you need to go to the emergency room to consider a CT scan of your abdomen.

## 2020-08-03 NOTE — ED Provider Notes (Signed)
[facility]    CSN: [id number] Arrival date & time: 08/03/20  0805      History   Chief Complaint Chief Complaint  Patient presents with   Abdominal Pain   Back Pain   Hip Pain   Diarrhea    HPI Cody Hurst is a 50 y.o. male.   Patient presents today with a weeklong history of generalized abdominal pain.  He reports associated decrease in bowel movements.  He does report having a small bowel movement earlier today.  He denies any obstipation, vomiting, fever, melena, hematochezia.  He denies any recent medication changes.  He reports having a history of diverticulitis that required surgical intervention several years ago.  He then developed ventral hernia related to incisions and had hernia repair surgery almost 1 year ago.  He denies additional abdominal surgeries.  He denies any focal abdominal pain.  He has tried MiraLAX and milk of magnesia with minimal improvement of symptoms.  Reports he is troubled with normal bowel habits since having previous surgery.  He denies any known sick contacts or suspicious food intake.  He has not seen gastroenterologist recently.  He does have a history of nephrolithiasis but states current symptoms are not similar to previous episodes of this condition.  Pain is rated 6 on a 0-10 pain scale, generalized throughout abdomen, described as aching, worse with palpation, no alleviating factors identified.   Past Medical History:  Diagnosis Date   Diverticulitis    Renal disorder    kidney stones    Patient Active Problem List   Diagnosis Date Noted   OSA (obstructive sleep apnea) 12/04/2018    Past Surgical History:  Procedure Laterality Date   CHOLECYSTECTOMY     per pt   HERNIA REPAIR     SMALL INTESTINE SURGERY         Home Medications    Prior to Admission medications   Medication Sig Start Date End Date Taking? Authorizing Provider  dicyclomine (BENTYL) 10 MG capsule Take 1 capsule (10 mg total) by mouth 4 (four)  times daily -  before meals and at bedtime. 08/03/20  Yes Darlis Wragg K, PA-C  loratadine-pseudoephedrine (CLARITIN-D 12 HOUR) 5-120 MG tablet Take 1 tablet by mouth daily as needed for allergies.   Yes [provider]  ondansetron (ZOFRAN ODT) 4 MG disintegrating tablet Take 1 tablet (4 mg total) by mouth every 8 (eight) hours as needed. 04/19/20  Yes McDonald, Mia A, PA-C  phenylephrine (NEO-SYNEPHRINE) 0.25 % nasal spray Place 1 spray into both nostrils every 6 (six) hours as needed for congestion.   Yes [provider]    Family History Family History  Problem Relation Age of Onset   Cancer Mother    Allergies Mother    Healthy Father     Social History Social History   Tobacco Use   Smoking status: Never   Smokeless tobacco: Never  Vaping Use   Vaping Use: Never used  Substance Use Topics   Alcohol use: Not Currently   Drug use: Never     Allergies   Patient has no known allergies.   Review of Systems Review of Systems  Constitutional:  Positive for activity change and appetite change. Negative for fatigue and fever.  Respiratory:  Negative for cough and shortness of breath.   Gastrointestinal:  Positive for abdominal pain, constipation and nausea. Negative for blood in stool, diarrhea and vomiting.  Musculoskeletal:  Negative for arthralgias and myalgias.  Neurological:  Negative  for dizziness, light-headedness and headaches.    Physical Exam Triage Vital Signs ED Triage Vitals  Enc Vitals Group     BP 08/03/20 0825 117/79     Pulse Rate 08/03/20 0825 81     Resp 08/03/20 0825 18     Temp 08/03/20 0825 97.7 F (36.5 C)     Temp src --      SpO2 08/03/20 0825 96 %     Weight --      Height --      Head Circumference --      Peak Flow --      Pain Score 08/03/20 0822 6     Pain Loc --      Pain Edu? --      Excl. in GC? --    No data found.  Updated Vital Signs BP 117/79   Pulse 81   Temp 97.7 F (36.5 C)   Resp 18   SpO2 96%    Visual Acuity Right Eye Distance:   Left Eye Distance:   Bilateral Distance:    Right Eye Near:   Left Eye Near:    Bilateral Near:     Physical Exam Vitals reviewed.  Constitutional:      General: He is awake.     Appearance: Normal appearance. He is normal weight. He is not ill-appearing.     Comments: Very pleasant male appears stated age in no acute distress  HENT:     Head: Normocephalic and atraumatic.  Cardiovascular:     Rate and Rhythm: Normal rate and regular rhythm.     Heart sounds: Normal heart sounds, S1 normal and S2 normal. No murmur heard. Pulmonary:     Effort: Pulmonary effort is normal.     Breath sounds: Normal breath sounds. No stridor. No wheezing, rhonchi or rales.     Comments: Clear to auscultation bilaterally Abdominal:     General: Bowel sounds are normal.     Palpations: Abdomen is soft.     Tenderness: There is generalized abdominal tenderness. There is no right CVA tenderness, left CVA tenderness, guarding or rebound.     Comments: Tenderness throughout abdomen.  No evidence of acute abdomen on physical exam.  Neurological:     Mental Status: He is alert.  Psychiatric:        Behavior: Behavior is cooperative.     UC Treatments / Results  Labs (all labs ordered are listed, but only abnormal results are displayed) Labs Reviewed  CBC WITH DIFFERENTIAL/PLATELET  COMPREHENSIVE METABOLIC PANEL  POCT URINALYSIS DIPSTICK, ED / UC    EKG   Radiology DG Abdomen 1 View  Result Date: 08/03/2020 CLINICAL DATA:  Back pain and constipation. History of kidney stones. EXAM: ABDOMEN - 1 VIEW COMPARISON:  CT abdomen pelvis dated September 01, 2019. FINDINGS: The bowel gas pattern and stool burden are normal. Small calculus in the lower pole of the right kidney is unchanged. Previously seen calculi in the midpole of the right kidney are obscured by overlying stool. No definite left renal calculi. Prior cholecystectomy. IMPRESSION: 1. No acute findings. 2.  Unchanged right nephrolithiasis. Electronically Signed   By: Obie Dredge M.D.   On: 08/03/2020 09:18    Procedures Procedures (including critical care time)  Medications Ordered in UC Medications - No data to display  Initial Impression / Assessment and Plan / UC Course  I have reviewed the triage vital signs and the nursing notes.  Pertinent labs & imaging results  that were available during my care of the patient were reviewed by me and considered in my medical decision making (see chart for details).     Discussed that we are unable to obtain CT imaging of abdomen in clinic and if he has any worsening symptoms he will need to go directly to the emergency room.  KUB was obtained to monitor stool burden which showed normal bowel gas and stool burden.  UA was normal in clinic.  CBC and CMP pending.  Patient was prescribed dicyclomine to be used as needed for abdominal cramping.  He was provided contact information for gastroenterologist and encouraged to follow-up with this clinic as soon as possible.  Discussed at length that if he has any worsening symptoms or changing symptoms he needs to go to the emergency room to consider abdominal CT to which he expressed understanding.  Strict return precautions given to which patient expressed understanding.  Final Clinical Impressions(s) / UC Diagnoses   Final diagnoses:  Generalized abdominal pain     Discharge Instructions      Your abdominal x-ray did not show any evidence of constipation or obstruction which is good news.  I have called in a medication (dicyclomine) that will help with stomach cramping.  You can take this 4 times a day.  I recommend you eat a bland diet and drink plenty of fluid.  I have included the information for a gastroenterologist I would like you to follow-up with them.  As we discussed if anything worsens you need to go to the emergency room to consider a CT scan of your abdomen.     ED Prescriptions      Medication Sig Dispense Auth. Provider   dicyclomine (BENTYL) 10 MG capsule Take 1 capsule (10 mg total) by mouth 4 (four) times daily -  before meals and at bedtime. 20 capsule Maliah Pyles, Noberto Retort, PA-C      PDMP not reviewed this encounter.   Jeani Hawking, PA-C 08/03/20 0945

## 2020-08-03 NOTE — ED Triage Notes (Signed)
Pt reports abdominal pain radiating to back /hip area for about one week. States has been having loose bowel movements.  Reports history of diverticulitis. Pt also reports recent hernia operation in August 2021 and reports intermittent diarrhea or constipation frequently since then.

## 2021-02-07 NOTE — Progress Notes (Signed)
Erroneous encounter

## 2021-02-12 ENCOUNTER — Encounter: Payer: Self-pay | Admitting: Family

## 2021-02-12 DIAGNOSIS — Z7689 Persons encountering health services in other specified circumstances: Secondary | ICD-10-CM

## 2021-09-09 ENCOUNTER — Telehealth: Payer: No Typology Code available for payment source | Admitting: Family

## 2021-09-09 DIAGNOSIS — J069 Acute upper respiratory infection, unspecified: Secondary | ICD-10-CM

## 2021-09-09 MED ORDER — BENZONATATE 100 MG PO CAPS: 100.0000 mg | ORAL_CAPSULE | Freq: Three times a day (TID) | ORAL | 0 refills | Status: AC | PRN

## 2021-09-09 MED ORDER — FLUTICASONE PROPIONATE 50 MCG/ACT NA SUSP
2.0000 | Freq: Every day | NASAL | 6 refills | Status: AC
Start: 2021-09-09 — End: ?

## 2021-09-09 NOTE — Progress Notes (Signed)

## 2022-04-23 ENCOUNTER — Encounter (HOSPITAL_COMMUNITY): Payer: Self-pay | Admitting: Emergency Medicine

## 2022-04-23 ENCOUNTER — Emergency Department (HOSPITAL_COMMUNITY)
Admission: EM | Admit: 2022-04-23 | Discharge: 2022-04-23 | Disposition: A | Discharge status: Home / Self Care | Payer: PRIVATE HEALTH INSURANCE | Attending: Emergency Medicine | Admitting: Emergency Medicine

## 2022-04-23 ENCOUNTER — Emergency Department (HOSPITAL_COMMUNITY): Payer: PRIVATE HEALTH INSURANCE

## 2022-04-23 ENCOUNTER — Other Ambulatory Visit: Payer: Self-pay

## 2022-04-23 DIAGNOSIS — Z20822 Contact with and (suspected) exposure to covid-19: Secondary | ICD-10-CM | POA: Insufficient documentation

## 2022-04-23 DIAGNOSIS — E8729 Other acidosis: Secondary | ICD-10-CM | POA: Diagnosis not present

## 2022-04-23 DIAGNOSIS — R748 Abnormal levels of other serum enzymes: Secondary | ICD-10-CM | POA: Diagnosis not present

## 2022-04-23 DIAGNOSIS — J020 Streptococcal pharyngitis: Secondary | ICD-10-CM | POA: Diagnosis not present

## 2022-04-23 DIAGNOSIS — R140 Abdominal distension (gaseous): Secondary | ICD-10-CM | POA: Insufficient documentation

## 2022-04-23 DIAGNOSIS — R1084 Generalized abdominal pain: Secondary | ICD-10-CM | POA: Insufficient documentation

## 2022-04-23 DIAGNOSIS — D72829 Elevated white blood cell count, unspecified: Secondary | ICD-10-CM | POA: Diagnosis not present

## 2022-04-23 LAB — URINALYSIS, ROUTINE W REFLEX MICROSCOPIC
Bilirubin Urine: NEGATIVE
Glucose, UA: NEGATIVE mg/dL
Hgb urine dipstick: NEGATIVE
Ketones, ur: NEGATIVE mg/dL
Leukocytes,Ua: NEGATIVE
Nitrite: NEGATIVE
Protein, ur: NEGATIVE mg/dL
Specific Gravity, Urine: >1.03 — ABNORMAL HIGH (ref 1.005–1.030)
pH: 6 (ref 5.0–8.0)

## 2022-04-23 LAB — CBC
HCT: 46.8 % (ref 39.0–52.0)
Hemoglobin: 15.8 g/dL (ref 13.0–17.0)
MCH: 31.3 pg (ref 26.0–34.0)
MCHC: 33.8 g/dL (ref 30.0–36.0)
MCV: 92.7 fL (ref 80.0–100.0)
Platelets: 206 10*3/uL (ref 150–400)
RBC: 5.05 MIL/uL (ref 4.22–5.81)
RDW: 13.5 % (ref 11.5–15.5)
WBC: 7.8 10*3/uL (ref 4.0–10.5)
nRBC: 0 % (ref 0.0–0.2)

## 2022-04-23 LAB — RESP PANEL BY RT-PCR (RSV, FLU A&B, COVID)  RVPGX2
Influenza A by PCR: NEGATIVE
Influenza B by PCR: NEGATIVE
Resp Syncytial Virus by PCR: NEGATIVE
SARS Coronavirus 2 by RT PCR: NEGATIVE

## 2022-04-23 LAB — COMPREHENSIVE METABOLIC PANEL
ALT: 28 U/L (ref 0–44)
AST: 23 U/L (ref 15–41)
Albumin: 3.5 g/dL (ref 3.5–5.0)
Alkaline Phosphatase: 74 U/L (ref 38–126)
Anion gap: 14 (ref 5–15)
BUN: 18 mg/dL (ref 6–20)
CO2: 18 mmol/L — ABNORMAL LOW (ref 22–32)
Calcium: 8.8 mg/dL — ABNORMAL LOW (ref 8.9–10.3)
Chloride: 105 mmol/L (ref 98–111)
Creatinine, Ser: 1.21 mg/dL (ref 0.61–1.24)
GFR, Estimated: >60 mL/min (ref >60)
Glucose, Bld: 125 mg/dL — ABNORMAL HIGH (ref 70–99)
Potassium: 4.1 mmol/L (ref 3.5–5.1)
Sodium: 137 mmol/L (ref 135–145)
Total Bilirubin: 0.4 mg/dL (ref 0.3–1.2)
Total Protein: 6.7 g/dL (ref 6.5–8.1)

## 2022-04-23 LAB — LIPASE, BLOOD: Lipase: 78 U/L — ABNORMAL HIGH (ref 11–51)

## 2022-04-23 LAB — GROUP A STREP BY PCR: Group A Strep by PCR: DETECTED — AB

## 2022-04-23 MED ORDER — ONDANSETRON HCL 4 MG/2ML IJ SOLN
4.0000 mg | Freq: Once | INTRAMUSCULAR | Status: AC
Start: 2022-04-23 — End: 2022-04-23
  Administered 2022-04-23: 4 mg via INTRAVENOUS
  Filled 2022-04-23: qty 2

## 2022-04-23 MED ORDER — IOHEXOL 350 MG/ML SOLN
75.0000 mL | Freq: Once | INTRAVENOUS | Status: AC | PRN
  Administered 2022-04-23: 75 mL via INTRAVENOUS

## 2022-04-23 MED ORDER — PENICILLIN G BENZATHINE 1200000 UNIT/2ML IM SUSY
1.2000 10*6.[IU] | PREFILLED_SYRINGE | Freq: Once | INTRAMUSCULAR | Status: AC
  Administered 2022-04-23: 1.2 10*6.[IU] via INTRAMUSCULAR
  Filled 2022-04-23: qty 2

## 2022-04-23 MED ORDER — ONDANSETRON HCL 4 MG PO TABS: 4.0000 mg | ORAL_TABLET | Freq: Four times a day (QID) | ORAL | 0 refills | Status: AC

## 2022-04-23 MED ORDER — LACTATED RINGERS IV BOLUS
1000.0000 mL | Freq: Once | INTRAVENOUS | Status: AC
  Administered 2022-04-23: 1000 mL via INTRAVENOUS

## 2022-04-23 MED ORDER — MORPHINE SULFATE (PF) 2 MG/ML IV SOLN
2.0000 mg | Freq: Once | INTRAVENOUS | Status: AC
  Administered 2022-04-23: 2 mg via INTRAVENOUS
  Filled 2022-04-23: qty 1

## 2022-04-23 NOTE — ED Notes (Signed)
Patient transported to CT 

## 2022-04-23 NOTE — ED Triage Notes (Signed)
PT reports generalized abdominal cramping. States last regular bowel movement was Last Thursday, since then "its been all watery" Pt reports 4/10 pressure.    Pt also reports a "scratchy" throat, states several family member have been diagnosed w/ strep.

## 2022-04-23 NOTE — ED Provider Notes (Signed)
[facility] Provider Note   CSN: [id number] Arrival date & time: 04/23/22  [id number]     History  Chief Complaint  Patient presents with   Abdominal Pain    Cody Hurst is a 52 y.o. male.  HPI 52 year old gentleman with history of diverticulitis s/p sigmoid colectomy with anastomosis, constipation, nephrolithiasis presents to the ER with complaints of generalized abdominal pain and distention.  Symptoms are ongoing for several days.  Pain is about a 7/8 out of 10.  Has had nausea but no vomiting.  He has been having watery stools but no blood.  He also reports bilateral lower back pain.  He has been trying to eat foods that are gentle on the stomach such as soups.  Denies any fevers or chills.  Denies any dysuria, hematuria, testicular pain, penile discharge.  No chest pain, shortness of breath, cough.  He does endorse a "scratchy throat", and has had a family member who tested positive for strep.  States that he is concerned for possible "blockage".    Home Medications Prior to Admission medications   Medication Sig Start Date End Date Taking? Authorizing Provider  ondansetron (ZOFRAN) 4 MG tablet Take 1 tablet (4 mg total) by mouth every 6 (six) hours. 04/23/22  Yes Garald Balding, PA-C  benzonatate (TESSALON PERLES) 100 MG capsule Take 1 capsule (100 mg total) by mouth 3 (three) times daily as needed. 09/09/21   Sharion Balloon, FNP  dicyclomine (BENTYL) 10 MG capsule Take 1 capsule (10 mg total) by mouth 4 (four) times daily -  before meals and at bedtime. 08/03/20   Raspet, Erin K, PA-C  fluticasone (FLONASE) 50 MCG/ACT nasal spray Place 2 sprays into both nostrils daily. 09/09/21   Evelina Dun A, FNP  loratadine-pseudoephedrine (CLARITIN-D 12 HOUR) 5-120 MG tablet Take 1 tablet by mouth daily as needed for allergies.    [provider]  ondansetron (ZOFRAN ODT) 4 MG disintegrating tablet Take 1 tablet (4 mg total) by mouth every  8 (eight) hours as needed. 04/19/20   McDonald, Mia A, PA-C  phenylephrine (NEO-SYNEPHRINE) 0.25 % nasal spray Place 1 spray into both nostrils every 6 (six) hours as needed for congestion.    [provider]      Allergies    Patient has no known allergies.    Review of Systems   Review of Systems Ten systems reviewed and are negative for acute change, except as noted in the HPI.   Physical Exam Updated Vital Signs BP (!) 120/91   Pulse 72   Temp (!) 97.4 F (36.3 C)   Resp 16   Ht '6\' 2"'$  (1.88 m)   Wt 113.4 kg   SpO2 99%   BMI 32.10 kg/m  Physical Exam Vitals and nursing note reviewed.  Constitutional:      General: He is not in acute distress.    Appearance: He is well-developed.  HENT:     Head: Normocephalic and atraumatic.  Eyes:     Conjunctiva/sclera: Conjunctivae normal.  Cardiovascular:     Rate and Rhythm: Normal rate and regular rhythm.     Heart sounds: No murmur heard. Pulmonary:     Effort: Pulmonary effort is normal. No respiratory distress.     Breath sounds: Normal breath sounds.  Abdominal:     Palpations: Abdomen is soft.     Tenderness: There is abdominal tenderness. There is no right CVA tenderness or left CVA tenderness.  Comments: Abdomen mildly tender, distended but soft.  No CVA tenderness.  Musculoskeletal:        General: No swelling.     Cervical back: Neck supple.  Skin:    General: Skin is warm and dry.     Capillary Refill: Capillary refill takes less than 2 seconds.  Neurological:     Mental Status: He is alert.  Psychiatric:        Mood and Affect: Mood normal.     ED Results / Procedures / Treatments   Labs (all labs ordered are listed, but only abnormal results are displayed) Labs Reviewed  GROUP A STREP BY PCR - Abnormal; Notable for the following components:      Result Value   Group A Strep by PCR DETECTED (*)    All other components within normal limits  LIPASE, BLOOD - Abnormal; Notable for the following  components:   Lipase 78 (*)    All other components within normal limits  COMPREHENSIVE METABOLIC PANEL - Abnormal; Notable for the following components:   CO2 18 (*)    Glucose, Bld 125 (*)    Calcium 8.8 (*)    All other components within normal limits  URINALYSIS, ROUTINE W REFLEX MICROSCOPIC - Abnormal; Notable for the following components:   Specific Gravity, Urine >1.030 (*)    All other components within normal limits  RESP PANEL BY RT-PCR (RSV, FLU A&B, COVID)  RVPGX2  CBC    EKG None  Radiology CT ABDOMEN PELVIS W CONTRAST  Result Date: 04/23/2022 CLINICAL DATA:  Abdominal pain EXAM: CT ABDOMEN AND PELVIS WITH CONTRAST TECHNIQUE: Multidetector CT imaging of the abdomen and pelvis was performed using the standard protocol following bolus administration of intravenous contrast. RADIATION DOSE REDUCTION: This exam was performed according to the departmental dose-optimization program which includes automated exposure control, adjustment of the mA and/or kV according to patient size and/or use of iterative reconstruction technique. CONTRAST:  82m OMNIPAQUE IOHEXOL 350 MG/ML SOLN COMPARISON:  CT abdomen/pelvis 09/01/2019 FINDINGS: Lower chest: There is subsegmental atelectasis in the right lower lobe. The imaged heart is unremarkable. Hepatobiliary: There is fatty infiltration of the liver. There are no focal lesions. The gallbladder is surgically absent. There is no biliary ductal dilatation. Pancreas: Unremarkable. Spleen: Unremarkable. Adrenals/Urinary Tract: The adrenals are unremarkable. There are small right renal cysts requiring no specific imaging follow-up. There is a 0.8 cm stone in the right kidney. There are no other stones in either kidney or along the course of either ureter. There is no hydronephrosis or hydroureter. There is symmetric excretion of contrast into the collecting systems on the delayed images. The bladder is unremarkable. Stomach/Bowel: The stomach is  unremarkable. There is no evidence of bowel obstruction. There is no abnormal bowel wall thickening or inflammatory change. Postsurgical changes are noted in the sigmoid colon. The appendix is normal. Vascular/Lymphatic: The abdominal aorta is normal in course and caliber. The major branch vessels are patent. The main portal and splenic veins are patent. There is no abdominal or pelvic lymphadenopathy. Reproductive: The prostate and seminal vesicles are unremarkable. Other: There is no ascites or free air. Postsurgical changes are noted along the midline anterior abdominal wall. Musculoskeletal: There is no acute osseous abnormality or suspicious osseous lesion. There is grade 1 anterolisthesis of L4 on L5 with bilateral pars defects, unchanged. IMPRESSION: 1. No acute findings in the abdomen or pelvis. 2. Nonobstructing right renal stone. 3. Fatty infiltration of the liver. 4. 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 Electronically Signed   By: Valetta Mole M.D.   On: 04/23/2022 10:18    Procedures Procedures    Medications Ordered in ED Medications  ondansetron (ZOFRAN) injection 4 mg (4 mg Intravenous Given 04/23/22 0747)  lactated ringers bolus 1,000 mL (0 mLs Intravenous Stopped 04/23/22 0851)  morphine (PF) 2 MG/ML injection 2 mg (2 mg Intravenous Given 04/23/22 0919)  penicillin g benzathine (BICILLIN LA) 1200000 UNIT/2ML injection 1.2 Million Units (1.2 Million Units Intramuscular Given 04/23/22 1037)  iohexol (OMNIPAQUE) 350 MG/ML injection 75 mL (75 mLs Intravenous Contrast Given 04/23/22 1000)    ED Course/ Medical Decision Making/ A&P                             Medical Decision Making Amount and/or Complexity of Data Reviewed Labs: ordered. Radiology: ordered.  Risk Prescription drug management.   INITIAL IMPRESSION / ASSESSMENT AND PLAN / ED COURSE   Pertinent labs & imaging results that were available during my care of the patient were reviewed by  me and considered in my medical decision making (see chart for details).   This patient is Presenting for Evaluation of abdominal pain, diarrhea, which does require a range of treatment options, and is a complaint that involves a high risk of morbidity and mortality.   The Differential Diagnoses includes but is not exclusive to viral gastroenteritis, diverticulitis, small bowel obstruction/ileus, renal stones     I did obtain Additional Historical Information from chart review     Clinical Laboratory Tests ordered, reviewed, and interpreted by me  -CBC w/o leukocytosis, normal Hgb  -CMP with a CO2 of 18, normal anion gap, normal LFTs, normal electrolytes, suspect secondary to diarrhea.   -Group A strep is positive.  - Lipase elevated but not consistent with pancreatitis -UA without signs of infection -COVID flu RSV negative    Radiologic Tests Ordered,  I independently interpreted the images and agree with radiology interpretation.  -CT of the abdomen pelvis with findings as below IMPRESSION:  1. No acute findings in the abdomen or pelvis.  2. Nonobstructing right renal stone.  3. Fatty infiltration of the liver.  4. Unchanged bilateral L4-L5 pars defects with grade 1  anterolisthesis.     Cardiac Monitor Tracing: NA   Social Determinants of Health Risk: NA    Consult complete with NA   Medical Decision Making: Summary:  52 year old male who presents to the ER with complaints of abdominal pain.  Vitals overall reassuring.  Physical exam with abdominal distention and mild generalized tenderness, no CVA tenderness.  Has workup overall is reassuring, he has mild acidosis likely secondary to diarrhea, no anion gap.  He is group A strep is positive.  COVID and flu are negative.  CT of the abdomen without any acute findings, evidence of a nonobstructing right renal stone and fatty infiltration of the liver. He received a liter bolus, Zofran for nausea, morphine for pain.  Received  one-time dose of pen G IM for positive strep.  Overall tolerating p.o. well and reports he feels improved.  Suspect diarrhea possibly secondary to strep infection.  Recommended isolation for 24 hours.  Encouraged to continue to hydrate with fluids.  Will send home with Zofran.  Recommended PCP follow-up.  We discussed return precautions.  He voiced understanding and is agreeable.  Stable for discharge.   Reevaluation with update and discussion with patient who is agreeable to the above plan.  Considered admission: Considered admission however given no acute findings on abdominal exam, tolerating p.o. well, no other significant electrode abnormalities noted suspect to be stable for discharge.  Disposition: Home   Final Clinical Impression(s) / ED Diagnoses Final diagnoses:  Strep pharyngitis  Generalized abdominal pain    Rx / DC Orders ED Discharge Orders          Ordered    ondansetron (ZOFRAN) 4 MG tablet  Every 6 hours        04/23/22 1048              Lyndel Safe 04/23/22 1049    Pattricia Boss, MD 04/23/22 1327

## 2022-04-23 NOTE — ED Notes (Signed)
Pt verbalized understanding of discharge instructions. Opportunity for questions provided.  

## 2022-04-23 NOTE — Discharge Instructions (Addendum)
You were evaluated in the Emergency Department and after careful evaluation, we did not find any emergent condition requiring admission or further testing in the hospital.  Your workup today was overall reassuring.  Your strep test was positive.  You have been treated for this today.  Your CT scan did not show any signs of intra-abdominal abnormality.  Please continue to drink plenty of fluids.  Take Zofran as needed for nausea.  Please return to the Emergency Department if you experience any worsening of your condition.  We encourage you to follow up with a primary care provider.  Thank you for allowing [place] to be a part of your care.

## 2022-09-27 ENCOUNTER — Ambulatory Visit
Admission: EM | Admit: 2022-09-27 | Discharge: 2022-09-27 | Disposition: A | Discharge status: Home / Self Care | Payer: BC Managed Care – PPO | Attending: Physician Assistant | Admitting: Physician Assistant

## 2022-09-27 DIAGNOSIS — J029 Acute pharyngitis, unspecified: Secondary | ICD-10-CM

## 2022-09-27 LAB — POCT RAPID STREP A (OFFICE): Rapid Strep A Screen: NEGATIVE

## 2022-09-27 MED ORDER — AMOXICILLIN 500 MG PO CAPS: 500.0000 mg | ORAL_CAPSULE | Freq: Three times a day (TID) | ORAL | 0 refills | Status: AC

## 2022-09-27 NOTE — ED Triage Notes (Signed)
"  Wife was diagnosed with strep the other day and my throat hurts". No fever. No new/unexplained rash.

## 2022-09-27 NOTE — ED Provider Notes (Signed)
[facility]    CSN: [id number] Arrival date & time: 09/27/22  1638      History   Chief Complaint Chief Complaint  Patient presents with   Sore Throat    HPI Cody Hurst is a 52 y.o. male.   Patient here today for evaluation of sore throat after wife was diagnosed with strep recently.  He has not had any fever.  He denies any cough or congestion.  He has not had any vomiting or diarrhea.  He has taken over-the-counter medication with mild relief.  The history is provided by the patient.  Sore Throat Pertinent negatives include no abdominal pain and no shortness of breath.    Past Medical History:  Diagnosis Date   Diverticulitis    Renal disorder    kidney stones    Patient Active Problem List   Diagnosis Date Noted   OSA (obstructive sleep apnea) 12/04/2018    Past Surgical History:  Procedure Laterality Date   CHOLECYSTECTOMY     per pt   HERNIA REPAIR     SMALL INTESTINE SURGERY         Home Medications    Prior to Admission medications   Medication Sig Start Date End Date Taking? Authorizing Provider  amoxicillin (AMOXIL) 500 MG capsule Take 1 capsule (500 mg total) by mouth 3 (three) times daily for 10 days. 09/27/22 10/07/22 Yes Tomi Bamberger, PA-C  benzonatate (TESSALON PERLES) 100 MG capsule Take 1 capsule (100 mg total) by mouth 3 (three) times daily as needed. 09/09/21   Junie Spencer, FNP  dicyclomine (BENTYL) 10 MG capsule Take 1 capsule (10 mg total) by mouth 4 (four) times daily -  before meals and at bedtime. 08/03/20   Raspet, Erin K, PA-C  fluticasone (FLONASE) 50 MCG/ACT nasal spray Place 2 sprays into both nostrils daily. 09/09/21   Jannifer Rodney A, FNP  loratadine-pseudoephedrine (CLARITIN-D 12 HOUR) 5-120 MG tablet Take 1 tablet by mouth daily as needed for allergies.    [provider]  ondansetron (ZOFRAN ODT) 4 MG disintegrating tablet Take 1 tablet (4 mg total) by mouth every 8 (eight) hours as needed. 04/19/20    McDonald, Mia A, PA-C  ondansetron (ZOFRAN) 4 MG tablet Take 1 tablet (4 mg total) by mouth every 6 (six) hours. 04/23/22   Mare Ferrari, PA-C  phenylephrine (NEO-SYNEPHRINE) 0.25 % nasal spray Place 1 spray into both nostrils every 6 (six) hours as needed for congestion.    [provider]    Family History Family History  Problem Relation Age of Onset   Cancer Mother    Allergies Mother    Healthy Father     Social History Social History   Tobacco Use   Smoking status: Some Days    Types: Cigars   Smokeless tobacco: Never  Vaping Use   Vaping status: Never Used  Substance Use Topics   Alcohol use: Not Currently   Drug use: Never     Allergies   Patient has no known allergies.   Review of Systems Review of Systems  Constitutional:  Negative for chills and fever.  HENT:  Positive for sore throat. Negative for congestion and ear pain.   Eyes:  Negative for discharge and redness.  Respiratory:  Negative for cough and shortness of breath.   Gastrointestinal:  Negative for abdominal pain, nausea and vomiting.     Physical Exam Triage Vital Signs ED Triage Vitals  Encounter Vitals Group  BP 09/27/22 1652 109/73     Systolic BP Percentile --      Diastolic BP Percentile --      Pulse Rate 09/27/22 1652 87     Resp 09/27/22 1652 18     Temp 09/27/22 1652 98.7 F (37.1 C)     Temp Source 09/27/22 1652 Oral     SpO2 09/27/22 1652 98 %     Weight 09/27/22 1651 261 lb (118.4 kg)     Height 09/27/22 1651 6\' 2"  (1.88 m)     Head Circumference --      Peak Flow --      Pain Score 09/27/22 1651 3     Pain Loc --      Pain Education --      Exclude from Growth Chart --    No data found.  Updated Vital Signs BP 109/73 (BP Location: Left Arm)   Pulse 87   Temp 98.7 F (37.1 C) (Oral)   Resp 18   Ht 6\' 2"  (1.88 m)   Wt 261 lb (118.4 kg)   SpO2 98%   BMI 33.51 kg/m      Physical Exam Vitals and nursing note reviewed.  Constitutional:       General: He is not in acute distress.    Appearance: Normal appearance. He is not ill-appearing.  HENT:     Head: Normocephalic and atraumatic.     Right Ear: Tympanic membrane normal.     Left Ear: Tympanic membrane normal.     Nose: Nose normal. No congestion.     Mouth/Throat:     Mouth: Mucous membranes are moist.     Pharynx: Oropharynx is clear. Posterior oropharyngeal erythema present. No oropharyngeal exudate.  Eyes:     Conjunctiva/sclera: Conjunctivae normal.  Cardiovascular:     Rate and Rhythm: Normal rate and regular rhythm.     Heart sounds: Normal heart sounds. No murmur heard. Pulmonary:     Effort: Pulmonary effort is normal. No respiratory distress.     Breath sounds: Normal breath sounds. No wheezing, rhonchi or rales.  Skin:    General: Skin is warm and dry.  Neurological:     Mental Status: He is alert.  Psychiatric:        Mood and Affect: Mood normal.        Thought Content: Thought content normal.      UC Treatments / Results  Labs (all labs ordered are listed, but only abnormal results are displayed) Labs Reviewed  POCT RAPID STREP A (OFFICE)    EKG   Radiology No results found.  Procedures Procedures (including critical care time)  Medications Ordered in UC Medications - No data to display  Initial Impression / Assessment and Plan / UC Course  I have reviewed the triage vital signs and the nursing notes.  Pertinent labs & imaging results that were available during my care of the patient were reviewed by me and considered in my medical decision making (see chart for details).    Amoxicillin prescribed for strep coverage given known exposure despite negative rapid strep.  Encouraged follow-up if no gradual improvement with any further concerns.  Final Clinical Impressions(s) / UC Diagnoses   Final diagnoses:  Acute pharyngitis, unspecified etiology   Discharge Instructions   None    ED Prescriptions     Medication Sig Dispense  Auth. Provider   amoxicillin (AMOXIL) 500 MG capsule Take 1 capsule (500 mg total) by mouth 3 (three) times  daily for 10 days. 30 capsule Tomi Bamberger, PA-C      PDMP not reviewed this encounter.   Tomi Bamberger, PA-C 09/27/22 1927

## 2023-02-11 IMAGING — DX DG ABDOMEN 1V
1 series · 1 of 1 positions shown · non-contrast
Comparison: CT abdomen pelvis dated September 01, 2019.

CLINICAL DATA: Back pain and constipation. History of kidney
stones.

EXAM:
ABDOMEN - 1 VIEW

[abdomen kub]
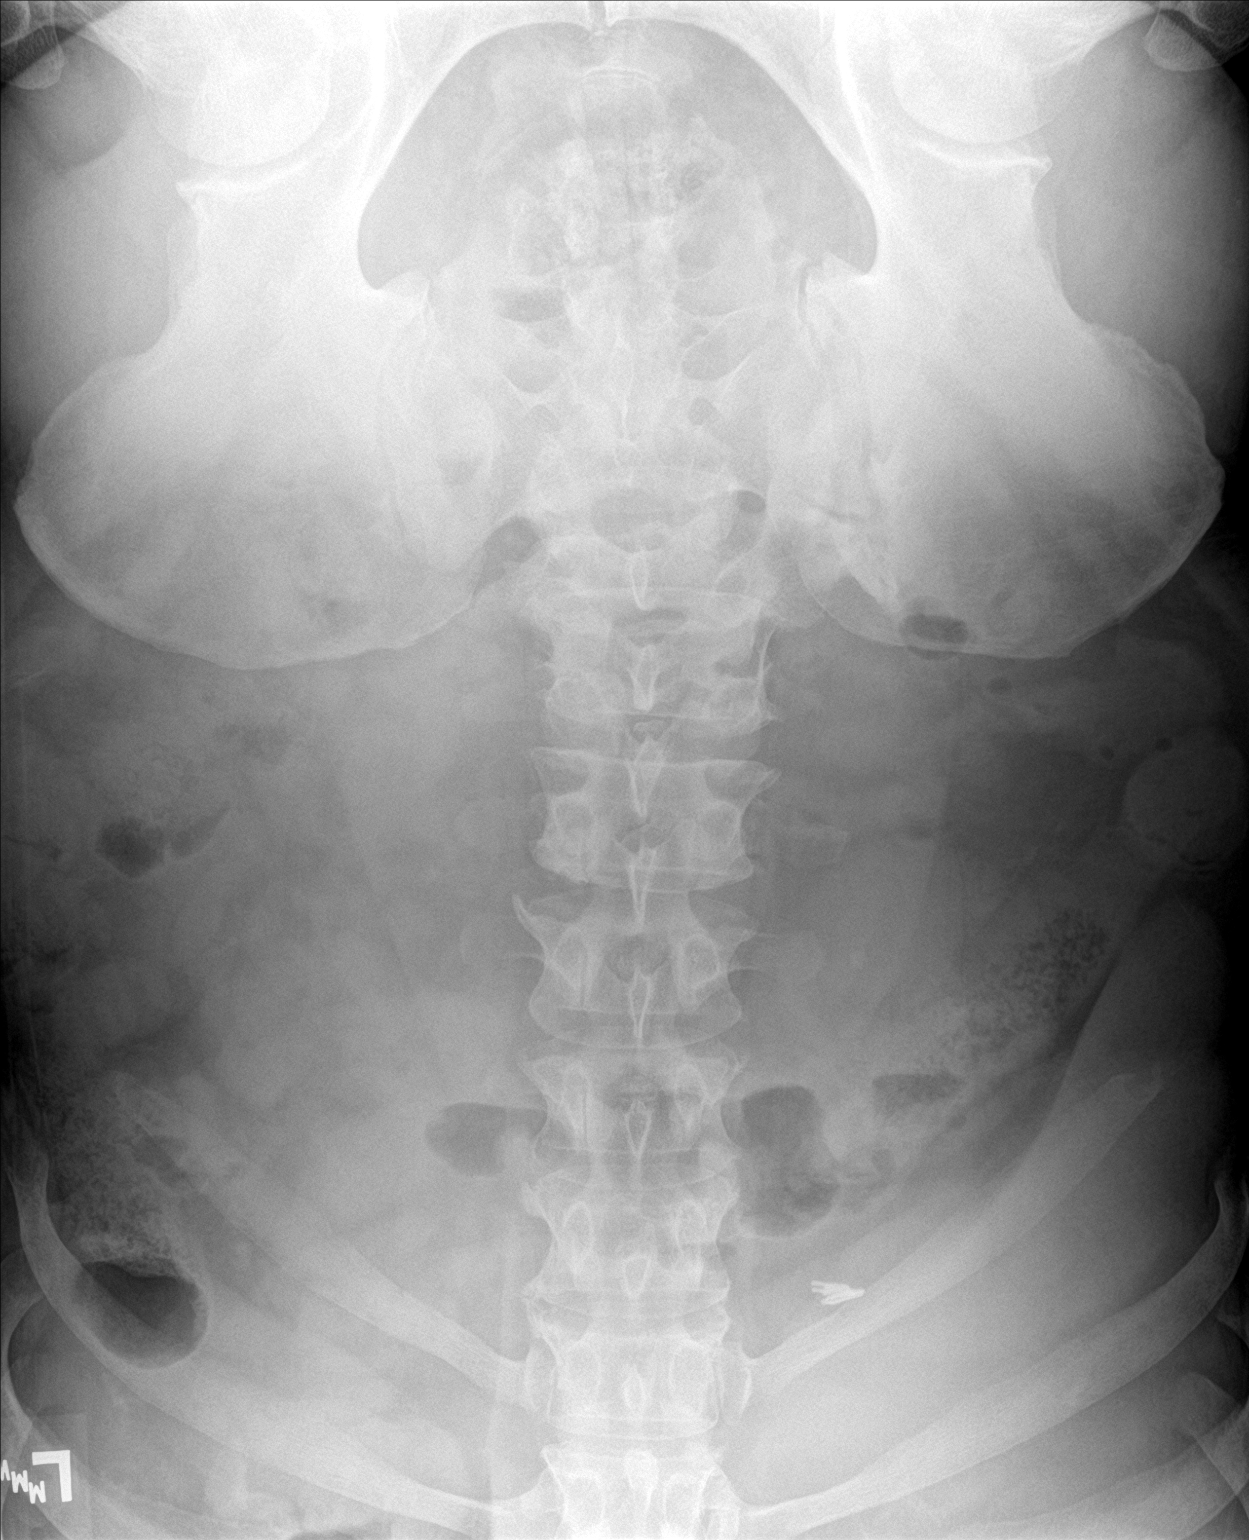

[1 of 1 positions shown; findings below may reference images not displayed]

FINDINGS: The bowel gas pattern and stool burden are normal. Small calculus in
the lower pole of the right kidney is unchanged. Previously seen
calculi in the midpole of the right kidney are obscured by overlying
stool. No definite left renal calculi. Prior cholecystectomy.
IMPRESSION: 1. No acute findings.
2. Unchanged right nephrolithiasis.

## 2023-05-16 ENCOUNTER — Emergency Department (HOSPITAL_BASED_OUTPATIENT_CLINIC_OR_DEPARTMENT_OTHER)

## 2023-05-16 ENCOUNTER — Encounter (HOSPITAL_BASED_OUTPATIENT_CLINIC_OR_DEPARTMENT_OTHER): Payer: Self-pay | Admitting: Emergency Medicine

## 2023-05-16 ENCOUNTER — Emergency Department (HOSPITAL_BASED_OUTPATIENT_CLINIC_OR_DEPARTMENT_OTHER)
Admission: EM | Admit: 2023-05-16 | Discharge: 2023-05-16 | Disposition: A | Discharge status: Home / Self Care | Attending: Emergency Medicine | Admitting: Emergency Medicine

## 2023-05-16 ENCOUNTER — Other Ambulatory Visit: Payer: Self-pay

## 2023-05-16 DIAGNOSIS — R630 Anorexia: Secondary | ICD-10-CM | POA: Diagnosis not present

## 2023-05-16 DIAGNOSIS — R110 Nausea: Secondary | ICD-10-CM | POA: Insufficient documentation

## 2023-05-16 DIAGNOSIS — R109 Unspecified abdominal pain: Secondary | ICD-10-CM

## 2023-05-16 DIAGNOSIS — R1084 Generalized abdominal pain: Secondary | ICD-10-CM | POA: Insufficient documentation

## 2023-05-16 LAB — COMPREHENSIVE METABOLIC PANEL
ALT: 32 U/L (ref 0–44)
AST: 20 U/L (ref 15–41)
Albumin: 4.1 g/dL (ref 3.5–5.0)
Alkaline Phosphatase: 76 U/L (ref 38–126)
Anion gap: 7 (ref 5–15)
BUN: 14 mg/dL (ref 6–20)
CO2: 26 mmol/L (ref 22–32)
Calcium: 9 mg/dL (ref 8.9–10.3)
Chloride: 108 mmol/L (ref 98–111)
Creatinine, Ser: 1.2 mg/dL (ref 0.61–1.24)
GFR, Estimated: >60 mL/min (ref >60)
Glucose, Bld: 98 mg/dL (ref 70–99)
Potassium: 4.1 mmol/L (ref 3.5–5.1)
Sodium: 141 mmol/L (ref 135–145)
Total Bilirubin: 0.5 mg/dL (ref 0.0–1.2)
Total Protein: 7 g/dL (ref 6.5–8.1)

## 2023-05-16 LAB — URINALYSIS, ROUTINE W REFLEX MICROSCOPIC
Bilirubin Urine: NEGATIVE
Glucose, UA: NEGATIVE mg/dL
Hgb urine dipstick: NEGATIVE
Ketones, ur: NEGATIVE mg/dL
Leukocytes,Ua: NEGATIVE
Nitrite: NEGATIVE
Protein, ur: NEGATIVE mg/dL
Specific Gravity, Urine: 1.023 (ref 1.005–1.030)
pH: 5.5 (ref 5.0–8.0)

## 2023-05-16 LAB — CBC
HCT: 44.3 % (ref 39.0–52.0)
Hemoglobin: 15.1 g/dL (ref 13.0–17.0)
MCH: 31.4 pg (ref 26.0–34.0)
MCHC: 34.1 g/dL (ref 30.0–36.0)
MCV: 92.1 fL (ref 80.0–100.0)
Platelets: 204 10*3/uL (ref 150–400)
RBC: 4.81 MIL/uL (ref 4.22–5.81)
RDW: 13.5 % (ref 11.5–15.5)
WBC: 7.6 10*3/uL (ref 4.0–10.5)
nRBC: 0 % (ref 0.0–0.2)

## 2023-05-16 LAB — LIPASE, BLOOD: Lipase: 14 U/L (ref 11–51)

## 2023-05-16 LAB — LACTIC ACID, PLASMA: Lactic Acid, Venous: 0.9 mmol/L (ref 0.5–1.9)

## 2023-05-16 MED ORDER — OXYCODONE-ACETAMINOPHEN 5-325 MG PO TABS: 1.0000 | ORAL_TABLET | ORAL | 0 refills | Status: AC | PRN

## 2023-05-16 MED ORDER — IOHEXOL 300 MG/ML  SOLN
100.0000 mL | Freq: Once | INTRAMUSCULAR | Status: AC | PRN
  Administered 2023-05-16: 100 mL via INTRAVENOUS

## 2023-05-16 MED ORDER — ONDANSETRON HCL 4 MG/2ML IJ SOLN
4.0000 mg | Freq: Once | INTRAMUSCULAR | Status: AC
  Administered 2023-05-16: 4 mg via INTRAVENOUS
  Filled 2023-05-16: qty 2

## 2023-05-16 MED ORDER — ONDANSETRON 4 MG PO TBDP: 4.0000 mg | ORAL_TABLET | Freq: Three times a day (TID) | ORAL | 0 refills | Status: AC | PRN

## 2023-05-16 MED ORDER — MORPHINE SULFATE (PF) 4 MG/ML IV SOLN
4.0000 mg | Freq: Once | INTRAVENOUS | Status: AC
  Administered 2023-05-16: 4 mg via INTRAVENOUS
  Filled 2023-05-16: qty 1

## 2023-05-16 NOTE — ED Notes (Signed)
 Patient transported to CT

## 2023-05-16 NOTE — ED Triage Notes (Signed)
 Pt via pov from home with generalized abdominal pain since last night. Pt reports colonoscopy on Wednesday; has had normal BM since then. Pt denies bloody or tarry stools. Pt alert & oriented; nad noted.

## 2023-05-16 NOTE — ED Provider Notes (Signed)
  EMERGENCY DEPARTMENT AT [facility] Provider Note   CSN: [id number] Arrival date & time: 05/16/23  0732     History  Chief Complaint  Patient presents with   Abdominal Pain    Cody Hurst is a 53 y.o. male.  The history is provided by the patient and medical records. No language interpreter was used.  Abdominal Pain Pain location:  Generalized Pain quality: aching, bloating and cramping   Pain radiates to:  Does not radiate Pain severity:  Severe Onset quality:  Gradual Duration:  11 hours Timing:  Constant Progression:  Waxing and waning Chronicity:  New Context: previous surgery   Context: not trauma   Relieved by:  Nothing Worsened by:  Palpation Ineffective treatments:  None tried Associated symptoms: anorexia and nausea   Associated symptoms: no chest pain, no chills, no constipation, no cough, no diarrhea, no dysuria, no fatigue, no fever, no flatus, no shortness of breath and no vomiting   Risk factors: multiple surgeries        Home Medications Prior to Admission medications   Medication Sig Start Date End Date Taking? Authorizing Provider  benzonatate (TESSALON PERLES) 100 MG capsule Take 1 capsule (100 mg total) by mouth 3 (three) times daily as needed. 09/09/21   Junie Spencer, FNP  dicyclomine (BENTYL) 10 MG capsule Take 1 capsule (10 mg total) by mouth 4 (four) times daily -  before meals and at bedtime. 08/03/20   Raspet, Erin K, PA-C  fluticasone (FLONASE) 50 MCG/ACT nasal spray Place 2 sprays into both nostrils daily. 09/09/21   Jannifer Rodney A, FNP  loratadine-pseudoephedrine (CLARITIN-D 12 HOUR) 5-120 MG tablet Take 1 tablet by mouth daily as needed for allergies.    [provider]  ondansetron (ZOFRAN ODT) 4 MG disintegrating tablet Take 1 tablet (4 mg total) by mouth every 8 (eight) hours as needed. 04/19/20   McDonald, Mia A, PA-C  ondansetron (ZOFRAN) 4 MG tablet Take 1 tablet (4 mg total) by mouth every 6 (six)  hours. 04/23/22   Mare Ferrari, PA-C  phenylephrine (NEO-SYNEPHRINE) 0.25 % nasal spray Place 1 spray into both nostrils every 6 (six) hours as needed for congestion.    [provider]      Allergies    Patient has no known allergies.    Review of Systems   Review of Systems  Constitutional:  Negative for chills, diaphoresis, fatigue and fever.  HENT:  Negative for congestion.   Respiratory:  Negative for cough, chest tightness and shortness of breath.   Cardiovascular:  Negative for chest pain and palpitations.  Gastrointestinal:  Positive for abdominal distention, abdominal pain, anorexia and nausea. Negative for constipation, diarrhea, flatus and vomiting.  Genitourinary:  Negative for dysuria, flank pain and frequency.  Musculoskeletal:  Negative for back pain, neck pain and neck stiffness.  Skin:  Negative for rash.  Neurological:  Negative for headaches.  Psychiatric/Behavioral:  Negative for agitation.   All other systems reviewed and are negative.   Physical Exam Updated Vital Signs BP (!) 117/90 (BP Location: Right Arm)   Pulse 85   Temp 97.8 F (36.6 C) (Temporal)   Resp 18   Ht 6\' 2"  (1.88 m)   Wt 116.6 kg   SpO2 96%   BMI 33.00 kg/m  Physical Exam Vitals and nursing note reviewed.  Constitutional:      General: He is not in acute distress.    Appearance: He is well-developed. He is not ill-appearing, toxic-appearing or  diaphoretic.  HENT:     Head: Normocephalic and atraumatic.  Eyes:     Conjunctiva/sclera: Conjunctivae normal.  Cardiovascular:     Rate and Rhythm: Normal rate and regular rhythm.     Heart sounds: No murmur heard. Pulmonary:     Effort: Pulmonary effort is normal. No respiratory distress.     Breath sounds: Normal breath sounds. No wheezing, rhonchi or rales.  Chest:     Chest wall: No tenderness.  Abdominal:     General: Bowel sounds are decreased. There is distension.     Palpations: Abdomen is soft.     Tenderness:  There is generalized abdominal tenderness. There is no right CVA tenderness or left CVA tenderness.  Genitourinary:    Comments: Deferred by patient Musculoskeletal:        General: No swelling.     Cervical back: Neck supple.  Skin:    General: Skin is warm and dry.     Capillary Refill: Capillary refill takes less than 2 seconds.     Coloration: Skin is not pale.     Findings: No rash.  Neurological:     General: No focal deficit present.     Mental Status: He is alert.  Psychiatric:        Mood and Affect: Mood normal.     ED Results / Procedures / Treatments   Labs (all labs ordered are listed, but only abnormal results are displayed) Labs Reviewed  LIPASE, BLOOD  COMPREHENSIVE METABOLIC PANEL  CBC  URINALYSIS, ROUTINE W REFLEX MICROSCOPIC  LACTIC ACID, PLASMA    EKG None  Radiology CT ABDOMEN PELVIS W CONTRAST Result Date: 05/16/2023 CLINICAL DATA:  Abdominal pain, acute, nonlocalized recent colonoscopy, abd pain, distension, nausea. rule out complication or obstruction. also prior diverticuliti with perf and bowel surgery EXAM: CT ABDOMEN AND PELVIS WITH CONTRAST TECHNIQUE: Multidetector CT imaging of the abdomen and pelvis was performed using the standard protocol following bolus administration of intravenous contrast. RADIATION DOSE REDUCTION: This exam was performed according to the departmental dose-optimization program which includes automated exposure control, adjustment of the mA and/or kV according to patient size and/or use of iterative reconstruction technique. CONTRAST:  OMNIPAQUE IOHEXOL 300 MG/ML  SOLN COMPARISON:  CT scan abdomen and pelvis from 04/23/2022. FINDINGS: Lower chest: There are dependent atelectatic changes in the visualized lung bases. No overt consolidation. No pleural effusion. The heart is normal in size. No pericardial effusion. Hepatobiliary: The liver is normal in size. Non-cirrhotic configuration. No suspicious mass. These is at least  moderate diffuse hepatic steatosis. No intrahepatic or extrahepatic bile duct dilation. Gallbladder is surgically absent. Pancreas: Unremarkable. No pancreatic ductal dilatation or surrounding inflammatory changes. Spleen: Within normal limits. No focal lesion. Adrenals/Urinary Tract: Adrenal glands are unremarkable. No suspicious renal mass. There are several simple cysts in the right kidney with largest arising from the upper pole measuring up to 1.6 x 1.8 cm. There are 2 adjacent sub 5 mm nonobstructing calculi in the right kidney interpolar region. No other nephroureterolithiasis on either side. No obstructive uropathy on either side. Urinary bladder is under distended, precluding optimal assessment. However, no large mass or stones identified. No perivesical fat stranding. Stomach/Bowel: Rectosigmoid colocolonic anastomosis noted. No disproportionate dilation of the small or large bowel loops. No evidence of abnormal bowel wall thickening or inflammatory changes. The appendix is unremarkable. There are multiple diverticula mainly in the left hemi colon, without imaging signs of diverticulitis. Vascular/Lymphatic: No ascites or pneumoperitoneum. No abdominal or pelvic  lymphadenopathy, by size criteria. No aneurysmal dilation of the major abdominal arteries. Reproductive: Normal size prostate. Symmetric seminal vesicles. Other: Midline surgical scar noted. The soft tissues and abdominal wall are otherwise unremarkable. Musculoskeletal: No suspicious osseous lesions. There are mild - moderate multilevel degenerative changes in the visualized spine. 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 IMPRESSION: *No acute inflammatory process identified within the abdomen or pelvis. No bowel obstruction. *Multiple other nonacute observations (such as diffuse hepatic steatosis, surgically absent gallbladder, right renal cysts, nonobstructing right renal calculi, rectosigmoid  colocolonic anastomosis, etc.), As described above. Electronically Signed   By: Jules Schick M.D.   On: 05/16/2023 09:18    Procedures Procedures    Medications Ordered in ED Medications  ondansetron (ZOFRAN) injection 4 mg (4 mg Intravenous Given 05/16/23 0810)  morphine (PF) 4 MG/ML injection 4 mg (4 mg Intravenous Given 05/16/23 0810)  iohexol (OMNIPAQUE) 300 MG/ML solution 100 mL (100 mLs Intravenous Contrast Given 05/16/23 0858)    ED Course/ Medical Decision Making/ A&P                                 Medical Decision Making Amount and/or Complexity of Data Reviewed Labs: ordered. Radiology: ordered.  Risk Prescription drug management.    Naftuli Dalsanto is a 53 y.o. male with a past medical history significant for previous diverticulitis with reported perforation requiring bowel surgery, subsequent hernia and hernia repair surgery, and previous cholecystectomy who recently had a colonoscopy 2 days ago who presents with severe abdominal pain.  According to patient, he had a normal bowel movement after his colonoscopy 2 days ago and was not having significant pain yesterday during the daytime.  He said that about 9 PM he started having pain that is now all over his abdomen.  He reports some nausea but no vomiting.  He reports he feels bloated and he feels that his bowels are not making the normal sounds they do.  He said he has tried to have bowel movement that was very small and had a small amount of flatus but possibly thinks is less than normal.  He denies any other trauma.  He denies any rectal pain or rectal bleeding.  Denies any dysuria but does say the pain goes to his suprapubic area.  Denies any flank or back pain.  Denies any fevers, chills, congestion, cough.  Denies any chest pain or shortness of breath.  Reports the pain gets up to 7 out of 10 in severity and is currently a 6 out of 10.  On exam, lungs clear.  Chest nontender.  Abdomen is diffusely tender and I did hear  some bowel sounds.  Back and flanks nontender.  He deferred GU exam and denied testicle or scrotal pain.  Patient otherwise resting but appears uncomfortable.  Given his recent procedure where he reports they took a polyp off and his history of bowel complications, will get a CT scan to further evaluate.  Will help rule out some hematoma, bowel obstruction, or diverticulitis.  Will give him some pain medicine and nausea medicine and get some screening labs and urinalysis as well.  Anticipate reassessment after workup to determine disposition.  CT scan reassuring.  Labs overall reassuring.  Patient is feeling better after medications.  CT scan did not show postoperative complication or new diverticulitis.  No obstruction.  Workup reassuring.  We feel he is safe  for discharge home and patient agrees.  He will follow-up with his GI team and PCP.  Will give prescription for pain medicine and nausea medicine.  He and other questions or concerns and was discharged in good condition feeling better.        Final Clinical Impression(s) / ED Diagnoses Final diagnoses:  Abdominal pain, unspecified abdominal location    Rx / DC Orders ED Discharge Orders          Ordered    oxyCODONE-acetaminophen (PERCOCET/ROXICET) 5-325 MG tablet  Every 4 hours PRN        05/16/23 1137    ondansetron (ZOFRAN-ODT) 4 MG disintegrating tablet  Every 8 hours PRN        05/16/23 1137           Clinical Impression: 1. Abdominal pain, unspecified abdominal location     Disposition: Discharge  Condition: Good  I have discussed the results, Dx and Tx plan with the pt(& family if present). He/she/they expressed understanding and agree(s) with the plan. Discharge instructions discussed at great length. Strict return precautions discussed and pt &/or family have verbalized understanding of the instructions. No further questions at time of discharge.    New Prescriptions   ONDANSETRON (ZOFRAN-ODT) 4 MG  DISINTEGRATING TABLET    Take 1 tablet (4 mg total) by mouth every 8 (eight) hours as needed for nausea or vomiting.   OXYCODONE-ACETAMINOPHEN (PERCOCET/ROXICET) 5-325 MG TABLET    Take 1 tablet by mouth every 4 (four) hours as needed for severe pain (pain score 7-10).    Follow Up: your GI doctor and PCP         Virgilene Stryker, Canary Brim, MD 05/16/23 1139

## 2023-05-16 NOTE — ED Notes (Signed)
 Pt returned from CT

## 2023-05-16 NOTE — Discharge Instructions (Signed)
 Your history, exam, workup today led [place] to get a CT scan to look for some postoperative complication or problem given your history of abdominal issues and the workup was reassuring.  I suspect is some pain related to your recent procedure but there was no concerning findings today.  We feel you are safe for discharge home.  Please use the pain and nausea medicine to help with symptoms and rest and stay hydrated.  If any symptoms change or worsen acutely, please return to the nearest emergency department.  Please follow-up with your GI doctor and your PCP.

## 2024-02-15 ENCOUNTER — Emergency Department (HOSPITAL_BASED_OUTPATIENT_CLINIC_OR_DEPARTMENT_OTHER)
Admission: EM | Admit: 2024-02-15 | Discharge: 2024-02-15 | Disposition: A | Discharge status: Home / Self Care | Attending: Emergency Medicine | Admitting: Emergency Medicine

## 2024-02-15 ENCOUNTER — Encounter (HOSPITAL_BASED_OUTPATIENT_CLINIC_OR_DEPARTMENT_OTHER): Payer: Self-pay

## 2024-02-15 ENCOUNTER — Other Ambulatory Visit: Payer: Self-pay

## 2024-02-15 DIAGNOSIS — H6591 Unspecified nonsuppurative otitis media, right ear: Secondary | ICD-10-CM | POA: Insufficient documentation

## 2024-02-15 DIAGNOSIS — H9201 Otalgia, right ear: Secondary | ICD-10-CM | POA: Diagnosis present

## 2024-02-15 MED ORDER — AMOXICILLIN-POT CLAVULANATE 875-125 MG PO TABS: 1.0000 | ORAL_TABLET | Freq: Two times a day (BID) | ORAL | 0 refills | Status: AC

## 2024-02-15 NOTE — Discharge Instructions (Signed)
 As we discussed, no infection is felt present causing ear pain. There is fluid behind the eardrum likely related to sinus congestion. Suggest using antihistamines such as Benadryl or Zyrtec to help clear symptoms. A steroid nasal spray may help as well such as Nasacort or Flonase , both of which can be found over-the-counter. Fill the antibiotic prescription if pain intensifies, you develop a fever, sinus pain. Return to the ED for recheck as needed.

## 2024-02-15 NOTE — ED Provider Notes (Signed)
 " [place] EMERGENCY DEPARTMENT AT [facility] Provider Note   CSN: [id number] Arrival date & time: 02/15/24  8092     Patient presents with: No chief complaint on file.   Cody Hurst is a 53 y.o. male.   Patient to ED for evaluation of right ear pain and a sense of fullness. No drainage. No change in hearing. He reports it feels like fluid in the ear. No fever, sore throat, congestion.   The history is provided by the patient. No language interpreter was used.       Prior to Admission medications  Medication Sig Start Date End Date Taking? Authorizing Provider  amoxicillin -clavulanate (AUGMENTIN ) 875-125 MG tablet Take 1 tablet by mouth every 12 (twelve) hours. 02/15/24  Yes Montee Tallman, Margit, PA-C  benzonatate  (TESSALON  PERLES) 100 MG capsule Take 1 capsule (100 mg total) by mouth 3 (three) times daily as needed. 09/09/21   Lavell Bari LABOR, FNP  dicyclomine  (BENTYL ) 10 MG capsule Take 1 capsule (10 mg total) by mouth 4 (four) times daily -  before meals and at bedtime. 08/03/20   Raspet, Erin K, PA-C  fluticasone  (FLONASE ) 50 MCG/ACT nasal spray Place 2 sprays into both nostrils daily. 09/09/21   Lavell Bari A, FNP  loratadine-pseudoephedrine (CLARITIN-D 12 HOUR) 5-120 MG tablet Take 1 tablet by mouth daily as needed for allergies.    [provider]  ondansetron  (ZOFRAN  ODT) 4 MG disintegrating tablet Take 1 tablet (4 mg total) by mouth every 8 (eight) hours as needed. 04/19/20   McDonald, Mia A, PA-C  ondansetron  (ZOFRAN ) 4 MG tablet Take 1 tablet (4 mg total) by mouth every 6 (six) hours. 04/23/22   Vonn Hadassah LABOR, PA-C  ondansetron  (ZOFRAN -ODT) 4 MG disintegrating tablet Take 1 tablet (4 mg total) by mouth every 8 (eight) hours as needed for nausea or vomiting. 05/16/23   Tegeler, Lonni PARAS, MD  oxyCODONE -acetaminophen  (PERCOCET/ROXICET) 5-325 MG tablet Take 1 tablet by mouth every 4 (four) hours as needed for severe pain (pain score 7-10). 05/16/23    Tegeler, Lonni PARAS, MD  phenylephrine (NEO-SYNEPHRINE) 0.25 % nasal spray Place 1 spray into both nostrils every 6 (six) hours as needed for congestion.    [provider]    Allergies: Patient has no known allergies.    Review of Systems  Updated Vital Signs BP 122/77 (BP Location: Right Arm)   Pulse 85   Temp 98.2 F (36.8 C)   Resp 16   SpO2 95%   Physical Exam Constitutional:      Appearance: He is well-developed.  HENT:     Head: Normocephalic.     Nose: No mucosal edema.     Right Turbinates: Not enlarged.     Left Turbinates: Not enlarged.     Right Sinus: No maxillary sinus tenderness.     Left Sinus: No maxillary sinus tenderness.     Mouth/Throat:     Mouth: Mucous membranes are moist.  Eyes:     Pupils: Pupils are equal, round, and reactive to light.  Cardiovascular:     Rate and Rhythm: Normal rate.  Pulmonary:     Effort: Pulmonary effort is normal.  Abdominal:     General: There is no distension.  Musculoskeletal:        General: Normal range of motion.     Cervical back: Normal range of motion and neck supple.  Lymphadenopathy:     Cervical: No cervical adenopathy.  Skin:    General: Skin is warm  and dry.  Neurological:     General: No focal deficit present.     Mental Status: He is alert and oriented to person, place, and time.     (all labs ordered are listed, but only abnormal results are displayed) Labs Reviewed - No data to display  EKG: None  Radiology: No results found.   Procedures   Medications Ordered in the ED - No data to display  Clinical Course as of 02/15/24 2048  Sun Feb 15, 2024  2043 Patient with right ear fullness and discomfort. No fever. There is a middle ear effusion present without erythema or bulging of the TM. He has minimal right maxillary sinus tenderness. Otherwise normal exam. Discussed the use of antihistamines, steroid nasal spray. Written prescription provided for Augmentin  to fill if he  develops worsening pain and/or fever.  [SU]    Clinical Course User Index [SU] Odell Balls, PA-C                                 Medical Decision Making       Final diagnoses:  Fluid level behind tympanic membrane of right ear    ED Discharge Orders          Ordered    amoxicillin -clavulanate (AUGMENTIN ) 875-125 MG tablet  Every 12 hours        02/15/24 2045               Odell Balls, PA-C 02/15/24 2048    Dreama Longs, MD 02/20/24 [phone]  "

## 2024-02-15 NOTE — ED Triage Notes (Signed)
 Pt c/o earache since yesterday, feels like fluid is in it or something- like pressure. Denies fever, sore throat

## 2024-02-15 NOTE — ED Notes (Signed)
 DC paperwork given and verbally understood.
